# Patient Record
Sex: Female | Born: 1970
Health system: Southern US, Community
[De-identification: ages and names within clinical notes are randomized; demographics above are authoritative.]

## PROBLEM LIST (undated history)

## (undated) DIAGNOSIS — Z789 Other specified health status: Secondary | ICD-10-CM

## (undated) DIAGNOSIS — L0291 Cutaneous abscess, unspecified: Secondary | ICD-10-CM

## (undated) DIAGNOSIS — I00 Rheumatic fever without heart involvement: Secondary | ICD-10-CM

## (undated) DIAGNOSIS — Z872 Personal history of diseases of the skin and subcutaneous tissue: Secondary | ICD-10-CM

## (undated) DIAGNOSIS — S82899A Other fracture of unspecified lower leg, initial encounter for closed fracture: Secondary | ICD-10-CM

## (undated) HISTORY — PX: OTHER SURGICAL HISTORY: SHX169

## (undated) HISTORY — PX: BREAST SURGERY: SHX581

## (undated) HISTORY — DX: Personal history of diseases of the skin and subcutaneous tissue: Z87.2

## (undated) HISTORY — DX: Other specified health status: Z78.9

## (undated) HISTORY — PX: INCISE AND DRAIN ABCESS: PRO64

## (undated) HISTORY — DX: Rheumatic fever without heart involvement: I00

---

## 1999-10-11 ENCOUNTER — Emergency Department (HOSPITAL_COMMUNITY): Admission: EM | Admit: 1999-10-11 | Discharge: 1999-10-11 | Payer: Self-pay | Admitting: Emergency Medicine

## 1999-10-13 ENCOUNTER — Emergency Department (HOSPITAL_COMMUNITY): Admission: EM | Admit: 1999-10-13 | Discharge: 1999-10-13 | Payer: Self-pay | Admitting: Emergency Medicine

## 1999-11-11 ENCOUNTER — Emergency Department (HOSPITAL_COMMUNITY): Admission: EM | Admit: 1999-11-11 | Discharge: 1999-11-11 | Payer: Self-pay | Admitting: Emergency Medicine

## 1999-12-29 ENCOUNTER — Emergency Department (HOSPITAL_COMMUNITY): Admission: EM | Admit: 1999-12-29 | Discharge: 1999-12-29 | Payer: Self-pay | Admitting: Emergency Medicine

## 2000-02-29 ENCOUNTER — Emergency Department (HOSPITAL_COMMUNITY): Admission: EM | Admit: 2000-02-29 | Discharge: 2000-02-29 | Payer: Self-pay | Admitting: Emergency Medicine

## 2000-09-19 ENCOUNTER — Emergency Department (HOSPITAL_COMMUNITY): Admission: EM | Admit: 2000-09-19 | Discharge: 2000-09-19 | Payer: Self-pay | Admitting: Emergency Medicine

## 2003-06-13 ENCOUNTER — Emergency Department (HOSPITAL_COMMUNITY): Admission: AD | Admit: 2003-06-13 | Discharge: 2003-06-13 | Payer: Self-pay | Admitting: Emergency Medicine

## 2003-12-02 ENCOUNTER — Emergency Department (HOSPITAL_COMMUNITY): Admission: EM | Admit: 2003-12-02 | Discharge: 2003-12-02 | Payer: Self-pay | Admitting: Emergency Medicine

## 2003-12-21 ENCOUNTER — Emergency Department (HOSPITAL_COMMUNITY): Admission: EM | Admit: 2003-12-21 | Discharge: 2003-12-21 | Payer: Self-pay | Admitting: Emergency Medicine

## 2004-10-05 ENCOUNTER — Emergency Department (HOSPITAL_COMMUNITY): Admission: EM | Admit: 2004-10-05 | Discharge: 2004-10-05 | Payer: Self-pay | Admitting: Family Medicine

## 2005-03-05 ENCOUNTER — Emergency Department (HOSPITAL_COMMUNITY): Admission: EM | Admit: 2005-03-05 | Discharge: 2005-03-05 | Payer: Self-pay | Admitting: Emergency Medicine

## 2007-03-27 ENCOUNTER — Emergency Department (HOSPITAL_COMMUNITY): Admission: EM | Admit: 2007-03-27 | Discharge: 2007-03-27 | Payer: Self-pay | Admitting: Emergency Medicine

## 2007-10-20 ENCOUNTER — Emergency Department (HOSPITAL_COMMUNITY): Admission: EM | Admit: 2007-10-20 | Discharge: 2007-10-20 | Payer: Self-pay | Admitting: Emergency Medicine

## 2008-11-14 ENCOUNTER — Emergency Department (HOSPITAL_COMMUNITY): Admission: EM | Admit: 2008-11-14 | Discharge: 2008-11-14 | Payer: Self-pay | Admitting: Emergency Medicine

## 2009-04-09 ENCOUNTER — Emergency Department (HOSPITAL_COMMUNITY): Admission: EM | Admit: 2009-04-09 | Discharge: 2009-04-09 | Payer: Self-pay | Admitting: Emergency Medicine

## 2009-09-28 ENCOUNTER — Emergency Department (HOSPITAL_COMMUNITY): Admission: EM | Admit: 2009-09-28 | Discharge: 2009-09-28 | Payer: Self-pay | Admitting: Emergency Medicine

## 2009-10-25 ENCOUNTER — Emergency Department (HOSPITAL_COMMUNITY): Admission: EM | Admit: 2009-10-25 | Discharge: 2009-10-25 | Payer: Self-pay | Admitting: Emergency Medicine

## 2010-04-09 ENCOUNTER — Encounter: Payer: Self-pay | Admitting: Surgery

## 2010-05-17 ENCOUNTER — Emergency Department (HOSPITAL_COMMUNITY)
Admission: EM | Admit: 2010-05-17 | Discharge: 2010-05-17 | Disposition: A | Discharge status: Home / Self Care | Payer: Self-pay | Attending: Emergency Medicine | Admitting: Emergency Medicine

## 2010-05-17 DIAGNOSIS — K089 Disorder of teeth and supporting structures, unspecified: Secondary | ICD-10-CM | POA: Insufficient documentation

## 2010-06-04 LAB — POCT I-STAT, CHEM 8
BUN: 6 mg/dL (ref 6–23)
Calcium, Ion: 1.12 mmol/L (ref 1.12–1.32)
Chloride: 104 mEq/L (ref 96–112)
Glucose, Bld: 95 mg/dL (ref 70–99)
HCT: 46 % (ref 36.0–46.0)
Potassium: 3.8 mEq/L (ref 3.5–5.1)

## 2010-06-04 LAB — STREP A DNA PROBE

## 2010-06-04 LAB — MONONUCLEOSIS SCREEN: Mono Screen: NEGATIVE

## 2010-06-24 LAB — WOUND CULTURE

## 2010-10-17 ENCOUNTER — Emergency Department (HOSPITAL_COMMUNITY)
Admission: EM | Admit: 2010-10-17 | Discharge: 2010-10-17 | Disposition: A | Discharge status: Home / Self Care | Payer: Self-pay | Attending: Emergency Medicine | Admitting: Emergency Medicine

## 2010-10-17 DIAGNOSIS — L723 Sebaceous cyst: Secondary | ICD-10-CM | POA: Insufficient documentation

## 2010-10-17 DIAGNOSIS — Z87898 Personal history of other specified conditions: Secondary | ICD-10-CM | POA: Insufficient documentation

## 2010-10-21 ENCOUNTER — Emergency Department (HOSPITAL_COMMUNITY)
Admission: EM | Admit: 2010-10-21 | Discharge: 2010-10-21 | Disposition: A | Discharge status: Home / Self Care | Payer: Self-pay | Attending: Emergency Medicine | Admitting: Emergency Medicine

## 2010-10-21 DIAGNOSIS — Z23 Encounter for immunization: Secondary | ICD-10-CM | POA: Insufficient documentation

## 2010-10-21 DIAGNOSIS — L02219 Cutaneous abscess of trunk, unspecified: Secondary | ICD-10-CM | POA: Insufficient documentation

## 2010-10-23 ENCOUNTER — Emergency Department (HOSPITAL_COMMUNITY)
Admission: EM | Admit: 2010-10-23 | Discharge: 2010-10-23 | Disposition: A | Discharge status: Home / Self Care | Payer: Self-pay | Attending: Emergency Medicine | Admitting: Emergency Medicine

## 2010-10-23 DIAGNOSIS — L02219 Cutaneous abscess of trunk, unspecified: Secondary | ICD-10-CM | POA: Insufficient documentation

## 2010-10-26 ENCOUNTER — Encounter (INDEPENDENT_AMBULATORY_CARE_PROVIDER_SITE_OTHER): Payer: Self-pay | Admitting: General Surgery

## 2010-12-15 LAB — POCT I-STAT, CHEM 8
Calcium, Ion: 1.15
Creatinine, Ser: 1.1
Glucose, Bld: 96
HCT: 42
Potassium: 4
Sodium: 137
TCO2: 23

## 2010-12-15 LAB — WET PREP, GENITAL: Yeast Wet Prep HPF POC: NONE SEEN

## 2010-12-15 LAB — URINE CULTURE

## 2010-12-15 LAB — POCT URINALYSIS DIP (DEVICE)
Nitrite: NEGATIVE
Operator id: 200941

## 2010-12-15 LAB — URINALYSIS, MICROSCOPIC ONLY
Glucose, UA: NEGATIVE
Specific Gravity, Urine: 1.027
Urobilinogen, UA: 0.2
pH: 5.5

## 2010-12-15 LAB — GC/CHLAMYDIA PROBE AMP, GENITAL
Chlamydia, DNA Probe: NEGATIVE
GC Probe Amp, Genital: NEGATIVE

## 2010-12-15 LAB — RPR: RPR Ser Ql: NONREACTIVE

## 2011-01-09 ENCOUNTER — Emergency Department (HOSPITAL_COMMUNITY): Payer: Self-pay

## 2011-01-09 ENCOUNTER — Emergency Department (HOSPITAL_COMMUNITY)
Admission: EM | Admit: 2011-01-09 | Discharge: 2011-01-09 | Disposition: A | Discharge status: Home / Self Care | Payer: Self-pay | Attending: Emergency Medicine | Admitting: Emergency Medicine

## 2011-01-09 DIAGNOSIS — R42 Dizziness and giddiness: Secondary | ICD-10-CM | POA: Insufficient documentation

## 2011-01-09 DIAGNOSIS — R51 Headache: Secondary | ICD-10-CM | POA: Insufficient documentation

## 2011-01-09 DIAGNOSIS — H539 Unspecified visual disturbance: Secondary | ICD-10-CM | POA: Insufficient documentation

## 2011-03-20 DIAGNOSIS — Z872 Personal history of diseases of the skin and subcutaneous tissue: Secondary | ICD-10-CM

## 2011-03-20 HISTORY — DX: Personal history of diseases of the skin and subcutaneous tissue: Z87.2

## 2011-04-12 ENCOUNTER — Emergency Department (HOSPITAL_COMMUNITY)
Admission: EM | Admit: 2011-04-12 | Discharge: 2011-04-12 | Disposition: A | Discharge status: Home / Self Care | Payer: Self-pay | Attending: Emergency Medicine | Admitting: Emergency Medicine

## 2011-04-12 ENCOUNTER — Emergency Department (HOSPITAL_COMMUNITY): Payer: Self-pay

## 2011-04-12 ENCOUNTER — Encounter (HOSPITAL_COMMUNITY): Payer: Self-pay | Admitting: *Deleted

## 2011-04-12 DIAGNOSIS — N61 Mastitis without abscess: Secondary | ICD-10-CM | POA: Insufficient documentation

## 2011-04-12 DIAGNOSIS — F17200 Nicotine dependence, unspecified, uncomplicated: Secondary | ICD-10-CM | POA: Insufficient documentation

## 2011-04-12 DIAGNOSIS — N611 Abscess of the breast and nipple: Secondary | ICD-10-CM

## 2011-04-12 HISTORY — DX: Cutaneous abscess, unspecified: L02.91

## 2011-04-12 LAB — CBC
HCT: 40.4 % (ref 36.0–46.0)
Hemoglobin: 13.6 g/dL (ref 12.0–15.0)
MCH: 31.6 pg (ref 26.0–34.0)
Platelets: 220 10*3/uL (ref 150–400)

## 2011-04-12 LAB — POCT I-STAT, CHEM 8
BUN: 5 mg/dL — ABNORMAL LOW (ref 6–23)
Calcium, Ion: 1.18 mmol/L (ref 1.12–1.32)
Chloride: 106 mEq/L (ref 96–112)
Creatinine, Ser: 0.9 mg/dL (ref 0.50–1.10)
Glucose, Bld: 93 mg/dL (ref 70–99)
HCT: 38 % (ref 36.0–46.0)
Hemoglobin: 12.9 g/dL (ref 12.0–15.0)
Potassium: 4.4 mEq/L (ref 3.5–5.1)
Sodium: 140 mEq/L (ref 135–145)
TCO2: 25 mmol/L (ref 0–100)

## 2011-04-12 MED ORDER — LIDOCAINE HCL 2 % IJ SOLN
20.0000 mL | Freq: Once | INTRAMUSCULAR | Status: DC
  Filled 2011-04-12: qty 1

## 2011-04-12 MED ORDER — IBUPROFEN 200 MG PO TABS
600.0000 mg | ORAL_TABLET | Freq: Once | ORAL | Status: AC
  Administered 2011-04-12: 600 mg via ORAL
  Filled 2011-04-12: qty 3

## 2011-04-12 MED ORDER — DOXYCYCLINE HYCLATE 100 MG PO CAPS: 100.0000 mg | ORAL_CAPSULE | Freq: Two times a day (BID) | ORAL | Status: AC

## 2011-04-12 MED ORDER — HYDROCODONE-ACETAMINOPHEN 5-325 MG PO TABS: 1.0000 | ORAL_TABLET | ORAL | Status: AC | PRN

## 2011-04-12 MED ORDER — OXYCODONE-ACETAMINOPHEN 5-325 MG PO TABS
1.0000 | ORAL_TABLET | Freq: Once | ORAL | Status: AC
  Administered 2011-04-12: 1 via ORAL
  Filled 2011-04-12: qty 1

## 2011-04-12 NOTE — Progress Notes (Signed)
CM spoke with [name], CCS, who incised abscess and will be following pt.  Discussed choice of [facility] for services and Jovita Kussmaul for pcp.  Orders confirmed with Earl Gala and Darl Pikes, [facility] coordinator as correct for dressing change. Pt will have teachable person at home to learn dressing changes from [facility] staff.

## 2011-04-12 NOTE — Procedures (Signed)
Incision and Drainage Procedure Note  Pre-operative Diagnosis: Left breast abscess  Post-operative Diagnosis: same  Indications: This is a 41 yo female with a 4 day history of worsening pain and swelling just above her areola on her left breast.  She thought she had an abscess and came to [facility] for further treatment.  Anesthesia: 2% plain lidocaine  Procedure Details  The procedure, risks and complications have been discussed in detail (including, but not limited to infection, bleeding) with the patient, and the patient has signed consent to the procedure.  The skin was sterilely prepped and draped over the affected area in the usual fashion. After adequate local anesthesia, I&D with a #11 blade was performed on the left breast at 12 oclock just superior to her left areola. Purulent drainage: present.  The wound was then packed with 1/4 inch iodoform gauze.  Dry dressing was then applied. The patient was observed until stable.  Findings: Approximately 5cc of thick purulent drainage  EBL: 0 cc's  Drains: none  Condition: Tolerated procedure well   Complications: none.  Barnetta Chapel, PA-C 15:37, 04-12-11

## 2011-04-12 NOTE — Progress Notes (Signed)
Spoke with Dr Juleen China about orders needed for pt [facility], [facility] wound protocol) and community MD to follow pt. Pending orders

## 2011-04-12 NOTE — ED Provider Notes (Signed)
History     CSN: [id number]  Arrival date & time 04/12/11  1038   First MD Initiated Contact with Patient 04/12/11 1106      Chief Complaint  Patient presents with  . Abscess    left brease abscess    (Consider location/radiation/quality/duration/timing/severity/associated sxs/prior treatment) Patient is a 41 y.o. female presenting with abscess. The history is provided by the patient.  Abscess  This is a recurrent problem. The current episode started less than one week ago. The onset was gradual. The problem occurs continuously. The problem has been gradually worsening. Affected Location: left breast. The problem is severe. The abscess is characterized by redness, painfulness and swelling. The abscess first occurred at home. Pertinent negatives include no fever. Past medical history comments: prior similar abscess that opened and drained on its own several months ago. There were no sick contacts. She has received no recent medical care.  Assoc with purulent nipple d/c.  Past Medical History  Diagnosis Date  . Abscess     History reviewed. No pertinent past surgical history.  No family history on file.  History  Substance Use Topics  . Smoking status: Current Everyday Smoker  . Smokeless tobacco: Not on file  . Alcohol Use: No     Review of Systems  Constitutional: Negative for fever.  10 systems reviewed and are negative for acute change except as noted in the HPI.   Allergies  Review of patient's allergies indicates no known allergies.  Home Medications   Current Outpatient Rx  Name Route Sig Dispense Refill  . DIPHENHYDRAMINE-APAP (SLEEP) 25-500 MG PO TABS Oral Take 1 tablet by mouth at bedtime as needed.    . IBUPROFEN 200 MG PO TABS Oral Take 200 mg by mouth every 6 (six) hours as needed. pain      BP 119/77  Pulse 90  Temp(Src) 98.2 F (36.8 C) (Oral)  Resp 16  SpO2 98%  LMP 04/08/2011  Physical Exam  Nursing note and vitals  reviewed. Constitutional: She is oriented to person, place, and time. She appears well-developed and well-nourished.       Uncomfortable appearing  HENT:  Head: Normocephalic and atraumatic.  Right Ear: External ear normal.  Left Ear: External ear normal.  Mouth/Throat: Oropharynx is clear and moist.  Eyes: Pupils are equal, round, and reactive to light.  Neck: Normal range of motion. Neck supple.  Cardiovascular: Normal rate, regular rhythm and normal heart sounds.   Pulmonary/Chest: Effort normal and breath sounds normal. No respiratory distress.    Abdominal: Soft. She exhibits no distension. There is no tenderness.  Musculoskeletal: She exhibits no edema and no tenderness.  Neurological: She is alert and oriented to person, place, and time.  Skin: Skin is warm and dry.       See chest wall exam    ED Course  Procedures (including critical care time)  Labs Reviewed  POCT I-STAT, CHEM 8 - Abnormal; Notable for the following:    BUN 5 (*)    All other components within normal limits  CBC  I-STAT, CHEM 8   [place] Misc Soft Tissue  04/12/2011  *RADIOLOGY REPORT*  Clinical Data: 41 year old female with suspicion of left breast abscess.  The patient reports previous draining lesion of the breast.  ULTRASOUND OF HEAD/NECK SOFT TISSUES  Technique:  Ultrasound examination of the head and neck soft tissues was performed in the area of clinical concern.  Comparison:  None.  Findings: Gray scale and color Doppler scanning of  the left breast at the 12 o'clock position reveals a complex hypoechoic lesion measuring 2.9 x 2.0 x 3.1 cm just deep to the skin surface.  The lesion has both echogenic and hypoechoic anechoic internal components.  There is no convincing internal vascularity.  There is mild peripheral hypervascularity on some images.  IMPRESSION: 2.9 x 2.0 x 3.1 cm complex hypoechoic collection just deep to the skin surface at the 12 o'clock position of the left breast.  Favor abscess,  recommend correlation with findings at incision and drainage.  Original Report Authenticated By: Ulla Potash III, M.D.     1. Left breast abscess       MDM  11:30 AM Pt seen and evaluated. Abscess to left breast. [place] ordered for evaluation of depth.   2:00 PM Consult placed to general surgery, spoke with CCS PA who will see pt in ED with Dr Luisa Hart.   3:21 PM Abscess drained by CCS. They will order home health for daily dressing changes. Per request, I will give pt rx for doxycycline and pain medication.        Elwyn Reach Coleman, [place] 04/12/11 1525

## 2011-04-12 NOTE — ED Notes (Signed)
Surgeon at bedside.  

## 2011-04-12 NOTE — ED Provider Notes (Signed)
I received a call from the pharmacy about this patient. Doxycycline had been ordered but it is on national back order right now. Pharmacist requesting change of medication. Pt was in ED for treatment of a breast abscess and seen by [facility].  Medication changed to  Bactrim DS BID for 7 days, disp 14 pills  TG  Dorthula Matas, [place] 04/12/11 1627

## 2011-04-12 NOTE — Consult Note (Signed)
[name] 1970/08/11  [id number].   Primary Care MD: None Requesting MD: Dr. Juleen China Chief Complaint/Reason for Consult: left breast abscess HPI: this is a 41 yo BF who states that back in the summer she developed a left breast abscess, but it spontaneously ruptured.  No further treatment was done for it at that time.  On Sunday, she noticed another abscess forming in the same spot, just above her areola.  It began to get red around it and was extremely painful.  No nipple discharge or drainage from this area.  [facility].  No fevers or chills at home.  We were asked to evaluate for I&D.  Review of Systems: Please see HPI, otherwise all other systems reviewed and are negative.  No family history on file.  Past Medical History  Diagnosis Date  . Abscess     PSH: Diagnostic laparoscopy at age 13  Social History:  reports that she has been smoking.  She does not have any smokeless tobacco history on file. She reports that she does not drink alcohol or use illicit drugs.  Allergies: No Known Allergies  Medications Prior to Admission  Medication Dose Route Frequency Provider Last Rate Last Dose  . ibuprofen (ADVIL,MOTRIN) tablet 600 mg  600 mg Oral Once Shaaron Adler, [place]   600 mg at 04/12/11 1402  . oxyCODONE-acetaminophen (PERCOCET) 5-325 MG per tablet 1 tablet  1 tablet Oral Once Shaaron Adler, [place]   1 tablet at 04/12/11 1227  . DISCONTD: lidocaine (XYLOCAINE) 2 % (with pres) injection 400 mg  20 mL Intradermal Once Raeford Razor, MD       No current outpatient prescriptions on file as of 04/12/2011.    Blood pressure 119/77, pulse 90, temperature 98.2 F (36.8 C), temperature source Oral, resp. rate 16, last menstrual period 04/08/2011, SpO2 98.00%. Physical Exam: General: pleasant WD, WN, female in NAD Heart: regular rate and rhythm, normal s1, s2.  No murmurs, gallops, or rubs.  Palpable radial and pedal pulses  bilaterally Lungs: CTAB, no wheezes, rhonchi, or rales.  Respiratory effort is nonlabored. Breast: left breast with 2x2cm fluctuant abscess at 12 oclock just at the edge of the areola.  No nipple drainage at this time.  No masses or lumps noted.  Erythema, mild, surrounding the entire breast.  No induration.  Right breast is normal with no masses. Psych: A&O x 3 with an appropriate affect.   Results for orders placed during the hospital encounter of 04/12/11 (from the past 48 hour(s))  CBC     Status: Normal   Collection Time   04/12/11 12:40 PM      Component Value Range Comment   WBC 7.0  4.0 - 10.5 (K/uL)    RBC 4.30  3.87 - 5.11 (MIL/uL)    Hemoglobin 13.6  12.0 - 15.0 (g/dL)    HCT 30.8  65.7 - 84.6 (%)    MCV 94.0  78.0 - 100.0 (fL)    MCH 31.6  26.0 - 34.0 (pg)    MCHC 33.7  30.0 - 36.0 (g/dL)    RDW 96.2  95.2 - 84.1 (%)    Platelets 220  150 - 400 (K/uL)   POCT I-STAT, CHEM 8     Status: Abnormal   Collection Time   04/12/11 12:59 PM      Component Value Range Comment   Sodium 140  135 - 145 (mEq/L)    Potassium 4.4  3.5 -  5.1 (mEq/L)    Chloride 106  96 - 112 (mEq/L)    BUN 5 (*) 6 - 23 (mg/dL)    Creatinine, Ser 8.11  0.50 - 1.10 (mg/dL)    Glucose, Bld 93  70 - 99 (mg/dL)    Calcium, Ion 9.14  1.12 - 1.32 (mmol/L)    TCO2 25  0 - 100 (mmol/L)    Hemoglobin 12.9  12.0 - 15.0 (g/dL)    HCT 78.2  95.6 - 21.3 (%)    [place] Misc Soft Tissue  04/12/2011  *RADIOLOGY REPORT*  Clinical Data: 41 year old female with suspicion of left breast abscess.  The patient reports previous draining lesion of the breast.  ULTRASOUND OF HEAD/NECK SOFT TISSUES  Technique:  Ultrasound examination of the head and neck soft tissues was performed in the area of clinical concern.  Comparison:  None.  Findings: Gray scale and color Doppler scanning of the left breast at the 12 o'clock position reveals a complex hypoechoic lesion measuring 2.9 x 2.0 x 3.1 cm just deep to the skin surface.  The lesion has  both echogenic and hypoechoic anechoic internal components.  There is no convincing internal vascularity.  There is mild peripheral hypervascularity on some images.  IMPRESSION: 2.9 x 2.0 x 3.1 cm complex hypoechoic collection just deep to the skin surface at the 12 o'clock position of the left breast.  Favor abscess, recommend correlation with findings at incision and drainage.  Original Report Authenticated By: Harley Hallmark, M.D.       Assessment/Plan 1. Left breast abscess  Plan: 1. Bedside I&D performed.   2. Will d/c home with home health for dressing changes and doxycycline for 10 days. 3. RTC on 04-24-11 for wound check.  Tunisha Ruland E 04/12/2011, 3:18 PM

## 2011-04-12 NOTE — Procedures (Signed)
Seen and agree  

## 2011-04-12 NOTE — Progress Notes (Signed)
ED CM spoke with [facility] coordinator, Darl Pikes about services needed Darl Pikes requesting [facility] wound care protocol to be also ordered.

## 2011-04-12 NOTE — ED Notes (Signed)
Pt states she has had a abscess on left breast 3 month ago and the abscess has return.

## 2011-04-12 NOTE — ED Notes (Signed)
Ultrasound called due to delay. Not available at this time. Message left.

## 2011-04-12 NOTE — ED Notes (Signed)
Pt states last pain med Tylenol PM last night

## 2011-04-12 NOTE — Progress Notes (Signed)
ED CM spoke with Janett Billow, RN, and [place], [place] about home health services for pt Orders pending home health services.

## 2011-04-12 NOTE — Consult Note (Signed)
Needs I and D.  SEEN AND AGREE

## 2011-04-12 NOTE — Progress Notes (Signed)
ED CM spoke with pt about [facility] services.  Discussed home bound services.  Pt agreed to services and billing processes from [facility].  Pt given [facility] contact information.  Pt sent home with reinforcement supplies from ED staff and expecting home services on 04/13/11.  Cm discussed importance of pcp for follow up care.  Pt provided with a list of self pay [place] providers. Pt prefers/chose [facility] to see from the list Pt provided written resources also for housing, financial assistance, low cost medications, etc.  Pt voiced understanding and appreciation of services.

## 2011-04-13 NOTE — ED Provider Notes (Signed)
Medical screening examination/treatment/procedure(s) were conducted as a shared visit with non-physician practitioner(s) and myself.  I personally evaluated the patient during the encounter.  41 year old female with an abscess her left breast. Do not feel comfortable draining this myself. Ultrasound to assess for extent of involvement. Surgical consult.  Disposition per their recommendations.  Raeford Razor, MD 04/13/11 [phone]

## 2011-04-13 NOTE — ED Provider Notes (Signed)
Medical screening examination/treatment/procedure(s) were performed by non-physician practitioner and as supervising physician I was immediately available for consultation/collaboration.  Danicka Hourihan, MD 04/13/11 0911 

## 2011-04-24 ENCOUNTER — Encounter (INDEPENDENT_AMBULATORY_CARE_PROVIDER_SITE_OTHER): Payer: Self-pay

## 2011-04-27 ENCOUNTER — Encounter (INDEPENDENT_AMBULATORY_CARE_PROVIDER_SITE_OTHER): Payer: Self-pay | Admitting: Surgery

## 2011-04-27 ENCOUNTER — Ambulatory Visit (INDEPENDENT_AMBULATORY_CARE_PROVIDER_SITE_OTHER): Payer: Self-pay | Admitting: Surgery

## 2011-04-27 VITALS — BP 110/68 | HR 72 | Temp 98.6°F | Resp 18 | Ht 66.0 in | Wt 175.2 lb

## 2011-04-27 DIAGNOSIS — N61 Mastitis without abscess: Secondary | ICD-10-CM

## 2011-04-27 DIAGNOSIS — N611 Abscess of the breast and nipple: Secondary | ICD-10-CM

## 2011-04-27 NOTE — Patient Instructions (Signed)
Stop packing.  Return 1 month.  Mammogram in near future.

## 2011-04-27 NOTE — Progress Notes (Signed)
Post returns in followup for a left breast abscess was drained emergency room 2 weeks ago. She is doing well. She still has some swelling around the nipple and a small amount of drainage.  Exam: Left breast shows small incision above the nipple clean. Packing removed. No redness. Induration of nipple mild.  Impression: Left breast abscess improved after incision and drainage  Plan: Return in one month. She is not a mammogram and will need one some point.

## 2011-05-21 ENCOUNTER — Encounter (INDEPENDENT_AMBULATORY_CARE_PROVIDER_SITE_OTHER): Payer: Self-pay | Admitting: Surgery

## 2011-06-22 ENCOUNTER — Encounter (HOSPITAL_COMMUNITY): Payer: Self-pay

## 2011-06-22 ENCOUNTER — Emergency Department (HOSPITAL_COMMUNITY)
Admission: EM | Admit: 2011-06-22 | Discharge: 2011-06-22 | Disposition: A | Discharge status: Home / Self Care | Payer: No Typology Code available for payment source | Attending: Emergency Medicine | Admitting: Emergency Medicine

## 2011-06-22 DIAGNOSIS — IMO0002 Reserved for concepts with insufficient information to code with codable children: Secondary | ICD-10-CM

## 2011-06-22 LAB — POCT PREGNANCY, URINE: Preg Test, Ur: NEGATIVE

## 2011-06-22 MED ORDER — PROMETHAZINE HCL 25 MG PO TABS
ORAL_TABLET | ORAL | Status: AC
  Filled 2011-06-22: qty 3

## 2011-06-22 MED ORDER — LEVONORGESTREL 0.75 MG PO TABS
ORAL_TABLET | ORAL | Status: AC
  Administered 2011-06-22: 1.5 mg
  Filled 2011-06-22: qty 2

## 2011-06-22 MED ORDER — METRONIDAZOLE 500 MG PO TABS
ORAL_TABLET | ORAL | Status: AC
  Filled 2011-06-22: qty 4

## 2011-06-22 MED ORDER — AZITHROMYCIN 1 G PO PACK
PACK | ORAL | Status: AC
  Administered 2011-06-22: 1 g
  Filled 2011-06-22: qty 1

## 2011-06-22 MED ORDER — CEFIXIME 400 MG PO TABS
ORAL_TABLET | ORAL | Status: AC
  Administered 2011-06-22: 400 mg
  Filled 2011-06-22: qty 1
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
## 2011-06-25 ENCOUNTER — Encounter (INDEPENDENT_AMBULATORY_CARE_PROVIDER_SITE_OTHER): Payer: Self-pay | Admitting: Surgery

## 2011-07-17 ENCOUNTER — Encounter (INDEPENDENT_AMBULATORY_CARE_PROVIDER_SITE_OTHER): Payer: Self-pay | Admitting: Surgery

## 2011-07-24 ENCOUNTER — Encounter (INDEPENDENT_AMBULATORY_CARE_PROVIDER_SITE_OTHER): Payer: Self-pay | Admitting: Surgery

## 2012-01-02 ENCOUNTER — Encounter (HOSPITAL_COMMUNITY): Payer: Self-pay | Admitting: *Deleted

## 2012-01-02 ENCOUNTER — Emergency Department (HOSPITAL_COMMUNITY)
Admission: EM | Admit: 2012-01-02 | Discharge: 2012-01-02 | Disposition: A | Discharge status: Home / Self Care | Payer: Self-pay | Attending: Emergency Medicine | Admitting: Emergency Medicine

## 2012-01-02 DIAGNOSIS — N760 Acute vaginitis: Secondary | ICD-10-CM | POA: Insufficient documentation

## 2012-01-02 DIAGNOSIS — A499 Bacterial infection, unspecified: Secondary | ICD-10-CM | POA: Insufficient documentation

## 2012-01-02 DIAGNOSIS — B9689 Other specified bacterial agents as the cause of diseases classified elsewhere: Secondary | ICD-10-CM | POA: Insufficient documentation

## 2012-01-02 DIAGNOSIS — N611 Abscess of the breast and nipple: Secondary | ICD-10-CM

## 2012-01-02 DIAGNOSIS — N61 Mastitis without abscess: Secondary | ICD-10-CM | POA: Insufficient documentation

## 2012-01-02 MED ORDER — ONDANSETRON 4 MG PO TBDP
4.0000 mg | ORAL_TABLET | Freq: Once | ORAL | Status: AC
  Administered 2012-01-02: 4 mg via ORAL
  Filled 2012-01-02: qty 1

## 2012-01-02 MED ORDER — MORPHINE SULFATE 10 MG/ML IJ SOLN
10.0000 mg | Freq: Once | INTRAMUSCULAR | Status: AC
  Administered 2012-01-02: 10 mg via INTRAMUSCULAR
  Filled 2012-01-02: qty 1

## 2012-01-02 MED ORDER — LIDOCAINE HCL 2 % IJ SOLN
INTRAMUSCULAR | Status: AC
  Filled 2012-01-02: qty 20

## 2012-01-02 NOTE — ED Notes (Signed)
Surgeons at bedside.

## 2012-01-02 NOTE — Consult Note (Signed)
  [name] 02/24/1971  [id number].   Requesting MD: Dr. Rolan Bucco Chief Complaint/Reason for Consult: left breast abscess HPI: This is a 41 yo female who had a left breast I&D done earlier this year in February.  She began noticing a recurrence in this same area 2 days ago.  Pain progressively worsened.  Today she decided to come to [facility] for evaluation.  We were asked to see her for I&D.    Review of Systems:  Please see HPI ,other wise all other systems are negative  Family History  Problem Relation Age of Onset  . Heart disease Paternal Grandmother     Past Medical History  Diagnosis Date  . Abscess   . Rheumatic fever     Past Surgical History  Procedure Date  . Incise and drain abcess     lt breast    Social History:  reports that she has been smoking.  She does not have any smokeless tobacco history on file. She reports that she drinks alcohol. She reports that she does not use illicit drugs.  Allergies: No Known Allergies   (Not in a hospital admission)  Blood pressure 116/70, pulse 90, temperature 98.5 F (36.9 C), temperature source Oral, resp. rate 16, last menstrual period 12/10/2011, SpO2 99.00%. Physical Exam: General: Pleasant, WD, WN, 41 yo BF laying in bed in NAD Breast: left breast with circumferential erythema around her areola.  Some induration noted just superior to her areola.  Slight inversion of her nipple is noted.  Old scar present from previous I&D Right breast is normal in appearance.  I&D performed.  See procedure note Psych: A&O x3. Appropriate affect    No results found for this or any previous visit (from the past 48 hour(s)). No results found.     Assessment/Plan 1. 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 I&D performed today to take care of the acute infection.  She will need to return to see Dr. Johna Sheriff to consider another operation in the near future to excise this sinus  tract.  For now she has packing in place.  She can remove this on Friday.  After that, she can place dry gauze over incision and return to see Dr. Johna Sheriff for follow up.  She is given 7 days of doxycyline.  Dream Nodal E 01/02/2012, 2:47 PM Pager: [phone]

## 2012-01-02 NOTE — ED Notes (Signed)
Pt reports L breast abcess. Pain since yesterday. Redness and swelling noted. Hx same previously.

## 2012-01-02 NOTE — Progress Notes (Signed)
Pt with no pcp nor coverage as confirmed by pt CM spoke with pt to review list of self pay pcps to further assist her with prescriptions and health care.  Discussed and provided written information for health reform, free community cervical screening, discounted pharmacies, DSS, health dept, needymeds.org and financial assistance programs in [employer]  Pt voiced understanding and appreciation of resources and services offered

## 2012-01-02 NOTE — Consult Note (Signed)
Patient interviewed and examined, agree with PA note above.  [name] T [name] MD, FACS  01/02/2012 6:17 PM   

## 2012-01-02 NOTE — ED Notes (Signed)
Pt moved to acute room for general surgery consult.

## 2012-01-02 NOTE — Procedures (Signed)
Incision and Drainage Procedure Note  Pre-operative Diagnosis: Left breast abscess, recurrent  Post-operative Diagnosis: same  Indications: This is a 41 yo female who smokes who has a history of a prior left breast abscess that I I&D earlier this year.  She noticed it starting again 2 days ago.  She woke up this morning with severe pain and decided to come to [facility] to have this area on her left breast evaluated.  She was found to have a recurrent left breast abscess.  Anesthesia: 1% lidocaine with epinephrine  Procedure Details  The procedure, risks and complications have been discussed in detail (including, but not limited to airway compromise, infection, bleeding) with the patient, and the patient has signed consent to the procedure.  The skin was sterilely prepped and draped over the affected area in the usual fashion. After adequate local anesthesia, I&D with a #11 blade was performed on the left breast just superior to her areola. Purulent drainage: present, but mostly serosanguinous, cyst like drainage.  When patient was being anesthetized there was some expression of purulent drainage with lidocaine through a duct in her nipple.  After making the incision a cavity was found that was possible chronic in nature.  After I&D complete it was packed with 1/4" iodoform gauze. The patient was observed until stable.  Findings: Likely chronic sinus tract causing recurrent left breast abscesses  EBL: 2 cc's  Drains: none  Condition: Tolerated procedure well and Stable   Complications: none.  Lakira Ogando E 01/02/2012

## 2012-01-02 NOTE — ED Notes (Signed)
Pt taking metronidazole 500mg  since 12/28/11 for vaginal bacteria - reports taking only 2 pills, left breast tenderness/pain developed, abscess above left nipple noted. Pt reports having abscess that was drained in January 2013.

## 2012-01-08 ENCOUNTER — Telehealth (INDEPENDENT_AMBULATORY_CARE_PROVIDER_SITE_OTHER): Payer: Self-pay

## 2012-01-08 NOTE — Telephone Encounter (Deleted)
Message copied by Maryan Puls on Tue Jan 08, 2012 10:58 AM ------      Message from: Barnetta Chapel      Created: Fri Jan 04, 2012  7:11 AM      Regarding: RE: f/u appt       Thanks!      ----- Message -----         From: Maryan Puls, CMA         Sent: 01/03/2012   6:39 PM           To: Letha Cape, PA      Subject: f/u appt                                                 Patient has follow up appt on 01/11/12 @ 4:30 w/Dr. Johna Sheriff.            [name]      ----- Message -----         From: Letha Cape, PA         Sent: 01/02/2012   2:55 PM           To: Maryan Puls, CMA            This is a patient I did an I&D of a left breast abscess today.  She needs to see BH in 1-2 weeks for follow up and discuss excision of chronic sinus tract.  If you could call and schedule this for her I would appreciate it.  She was sent home from ED.

## 2012-01-08 NOTE — Telephone Encounter (Signed)
Left message for patient to call our office re: Reminder call  appointment with Dr. Johna Sheriff on 01/11/12 @ 4:30 pm.

## 2012-01-11 ENCOUNTER — Ambulatory Visit (INDEPENDENT_AMBULATORY_CARE_PROVIDER_SITE_OTHER): Payer: Self-pay | Admitting: General Surgery

## 2012-01-11 ENCOUNTER — Encounter (INDEPENDENT_AMBULATORY_CARE_PROVIDER_SITE_OTHER): Payer: Self-pay | Admitting: General Surgery

## 2012-01-11 VITALS — BP 102/76 | HR 76 | Temp 97.8°F | Resp 16 | Wt 182.8 lb

## 2012-01-11 DIAGNOSIS — N611 Abscess of the breast and nipple: Secondary | ICD-10-CM

## 2012-01-11 DIAGNOSIS — N61 Mastitis without abscess: Secondary | ICD-10-CM

## 2012-01-11 MED ORDER — OXYCODONE-ACETAMINOPHEN 5-325 MG PO TABS: 1.0000 | ORAL_TABLET | ORAL | Status: DC | PRN

## 2012-01-11 MED ORDER — CIPROFLOXACIN HCL 500 MG PO TABS: 500.0000 mg | ORAL_TABLET | Freq: Two times a day (BID) | ORAL | Status: AC

## 2012-01-11 NOTE — Progress Notes (Signed)
Chief complaint: Followup breast abscess History: Patient returns for followup of her recurrent subareolar abscess, status post I&D 2 weeks ago. She states she is still having quite a bit of pain.  Exam: The I&D site in the periareolar area still has some purulent drainage. I cleaned this out with saline and a gauze applicator and it appears open and well drained. I repacked this without a formal gauze and should remove this in 2 days.  Assessment and plan: Status post I&D of periareolar abscess. The inflammation and swelling is gone down and it appears to be well drained. 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 I discussed that she will need excision once all the acute inflammation has resolved. She was unable to take Flagyl and I gave her prescription for 10 days of Cipro. Return in 2 weeks.

## 2012-01-21 NOTE — ED Provider Notes (Signed)
History     CSN: [id number]  Arrival date & time 01/02/12  4098   First MD Initiated Contact with Patient 01/02/12 [id number]      Chief Complaint  Patient presents with  . Breast Pain  . Breast Abcess     (Consider location/radiation/quality/duration/timing/severity/associated sxs/prior treatment) HPI Comments: [name] 41 y.o. female   The chief complaint is: Patient presents with:   Breast Pain   Breast Abcess     C/O left breast abscess.  One previous lin jan 2013, same breast with hx of I&D. Patient states that this feels the same. Noticed breast tenderness behind the nipple yesterday . Pain, redness, heat and swelling worsening in its course.  Constant, 10/10, throbbing and aching.  Worse with palpation. Nothing gives relief.  Denies fevers, chills, myalgias, arthralgias, nausea, vomiting, diarrhea.   Denies fevers, chills, myalgias, arthralgias. Denies DOE, SOB, chest tightness or pressure, radiation to left arm, jaw or back, or diaphoresis. Denies dysuria, flank pain, suprapubic pain, frequency, urgency, or hematuria. Denies headaches, light headedness, weakness, visual disturbances. Denies abdominal pain, nausea, vomiting, diarrhea or constipation.     The history is provided by the patient.    Past Medical History  Diagnosis Date  . Abscess   . Rheumatic fever   . History of breast abscess 2013    Past Surgical History  Procedure Date  . Incise and drain abcess     lt breast    Family History  Problem Relation Age of Onset  . Heart disease Paternal Grandmother     History  Substance Use Topics  . Smoking status: Current Every Day Smoker -- 0.5 packs/day  . Smokeless tobacco: Not on file  . Alcohol Use: Yes    OB History    Grav Para Term Preterm Abortions TAB SAB Ect Mult Living                  Review of Systems  Constitutional: Negative.   HENT: Negative.   Eyes: Negative.   Respiratory: Negative.   Cardiovascular: Negative.     Gastrointestinal: Negative.   Genitourinary: Negative.   Musculoskeletal: Negative.   Skin: Positive for color change.  Neurological: Negative.   Hematological: Negative.   Psychiatric/Behavioral: Negative.   All other systems reviewed and are negative.    Allergies  Review of patient's allergies indicates no known allergies.  Home Medications   Current Outpatient Rx  Name  Route  Sig  Dispense  Refill  . IBUPROFEN 200 MG PO TABS   Oral   Take 400 mg by mouth every 8 (eight) hours as needed. For headache/pain         . METRONIDAZOLE 500 MG PO TABS   Oral   Take 500 mg by mouth 2 (two) times daily. x7 days. Pt only took 2 doses on 10/11 then d/c'd         . CIPROFLOXACIN HCL 500 MG PO TABS   Oral   Take 1 tablet (500 mg total) by mouth 2 (two) times daily.   20 tablet   0   . OXYCODONE-ACETAMINOPHEN 5-325 MG PO TABS   Oral   Take 1 tablet by mouth every 4 (four) hours as needed for pain.   30 tablet   0     BP 116/70  Pulse 90  Temp 98.5 F (36.9 C) (Oral)  Resp 16  SpO2 99%  LMP 12/10/2011  Physical Exam  Nursing note and vitals reviewed. Constitutional: She is oriented to  person, place, and time. She appears well-developed and well-nourished. No distress.  HENT:  Head: Normocephalic and atraumatic.  Eyes: Conjunctivae normal are normal. No scleral icterus.  Neck: Normal range of motion.  Cardiovascular: Normal rate, regular rhythm and normal heart sounds.  Exam reveals no gallop and no friction rub.   No murmur heard. Pulmonary/Chest: Effort normal and breath sounds normal. No respiratory distress.  Abdominal: Soft. Bowel sounds are normal. She exhibits no distension and no mass. There is no tenderness. There is no guarding.  Genitourinary:       Breast exam: Left breast examined. Large area of erythm posterior to  And completely encircling the L nipple and areola surgical scars noted superior to areola from previous I&D, TTP. No lymphangitis or  abnormal masses, no axillary LAD.   Neurological: She is alert and oriented to person, place, and time.  Skin: Skin is warm and dry. She is not diaphoretic.    ED Course  Procedures (including critical care time)  Labs Reviewed - No data to display No results found.    1. Left breast abscess       MDM  Pattient seen in shared visit with Dr. Roel Cluck.  I have spokent with PA Earl Gala from surgery who has agreed to to evaluate and treat the patient.    Filed Vitals:   01/02/12 0856  BP: 116/70  Pulse: 90  Temp: 98.5 F (36.9 C)  TempSrc: Oral  Resp: 16  SpO2: 99%   I&D performed.  Discharge and f/u handled by surgery. She will follow up with [facility]. Discussed reasons to seek immediate care. Patient expresses understanding and agrees with plan.         Arthor Captain, PA-C 01/21/12 2204

## 2012-01-23 ENCOUNTER — Encounter (INDEPENDENT_AMBULATORY_CARE_PROVIDER_SITE_OTHER): Payer: Self-pay

## 2012-01-25 ENCOUNTER — Encounter (INDEPENDENT_AMBULATORY_CARE_PROVIDER_SITE_OTHER): Payer: Self-pay | Admitting: General Surgery

## 2012-01-27 NOTE — ED Provider Notes (Signed)
Medical screening examination/treatment/procedure(s) were performed by non-physician practitioner and as supervising physician I was immediately available for consultation/collaboration.   Rolan Bucco, MD 01/27/12 [phone]

## 2012-03-20 ENCOUNTER — Encounter (INDEPENDENT_AMBULATORY_CARE_PROVIDER_SITE_OTHER): Payer: Self-pay | Admitting: General Surgery

## 2012-03-28 ENCOUNTER — Encounter (INDEPENDENT_AMBULATORY_CARE_PROVIDER_SITE_OTHER): Payer: Self-pay | Admitting: General Surgery

## 2012-06-08 ENCOUNTER — Encounter (HOSPITAL_COMMUNITY): Payer: Self-pay | Admitting: *Deleted

## 2012-06-08 ENCOUNTER — Emergency Department (HOSPITAL_COMMUNITY)
Admission: EM | Admit: 2012-06-08 | Discharge: 2012-06-08 | Disposition: A | Discharge status: Home / Self Care | Payer: Self-pay | Attending: Emergency Medicine | Admitting: Emergency Medicine

## 2012-06-08 DIAGNOSIS — IMO0002 Reserved for concepts with insufficient information to code with codable children: Secondary | ICD-10-CM | POA: Insufficient documentation

## 2012-06-08 DIAGNOSIS — Z872 Personal history of diseases of the skin and subcutaneous tissue: Secondary | ICD-10-CM | POA: Insufficient documentation

## 2012-06-08 DIAGNOSIS — Z8619 Personal history of other infectious and parasitic diseases: Secondary | ICD-10-CM | POA: Insufficient documentation

## 2012-06-08 DIAGNOSIS — F17200 Nicotine dependence, unspecified, uncomplicated: Secondary | ICD-10-CM | POA: Insufficient documentation

## 2012-06-08 DIAGNOSIS — L02412 Cutaneous abscess of left axilla: Secondary | ICD-10-CM

## 2012-06-08 MED ORDER — SULFAMETHOXAZOLE-TRIMETHOPRIM 800-160 MG PO TABS: 2.0000 | ORAL_TABLET | Freq: Two times a day (BID) | ORAL | Status: DC

## 2012-06-08 MED ORDER — OXYCODONE-ACETAMINOPHEN 5-325 MG PO TABS: 1.0000 | ORAL_TABLET | Freq: Four times a day (QID) | ORAL | Status: DC | PRN

## 2012-06-08 MED ORDER — IBUPROFEN 800 MG PO TABS
800.0000 mg | ORAL_TABLET | Freq: Once | ORAL | Status: AC
  Administered 2012-06-08: 800 mg via ORAL
  Filled 2012-06-08: qty 1

## 2012-06-08 MED ORDER — OXYCODONE-ACETAMINOPHEN 5-325 MG PO TABS
1.0000 | ORAL_TABLET | Freq: Once | ORAL | Status: AC
  Administered 2012-06-08: 1 via ORAL
  Filled 2012-06-08: qty 1

## 2012-06-08 NOTE — ED Provider Notes (Signed)
  Medical screening examination/treatment/procedure(s) were performed by non-physician practitioner and as supervising physician I was immediately available for consultation/collaboration.    Gerhard Munch, MD 06/08/12 1537

## 2012-06-08 NOTE — ED Provider Notes (Signed)
History     CSN: [id number]  Arrival date & time 06/08/12  1205   First MD Initiated Contact with Patient 06/08/12 1226      Chief Complaint  Patient presents with  . Abscess    (Consider location/radiation/quality/duration/timing/severity/associated sxs/prior treatment) HPI [name] is a 42 year old female with past history of abscesses who presents to the ED with swelling and painful masses under her left arm and anterior to her left ear which she believes are abscesses. The mass near her left ear began 1 week ago. She states she thought it was a blackhead and scratched it. It has continued to swell and has become more painful. The mass under her left arm began 3 days ago. She reports the pain is worsening, the mass is increasing in size, and it hurts to move her arm. She does report shaving her axilla recently and states this probably irritated the painful mass. She has used warm compresses on both masses, which has provided some relief of the pain. She has also taken some ibuprofen and gets some mild relief of the pain. She denies fever, chills, nausea, and vomiting. She denies any erythematous streaking. She reports having a history of abscesses on her legs, thoracic back, and left armpit. She states her partner doesn't "wash his sheets very often". They are sexually active and use condoms. She denies any history of HIV and reports recently being tested for STDs including HIV.  Past Medical History  Diagnosis Date  . Abscess   . Rheumatic fever   . History of breast abscess 2013    Past Surgical History  Procedure Laterality Date  . Incise and drain abcess      lt breast    Family History  Problem Relation Age of Onset  . Heart disease Paternal Grandmother     History  Substance Use Topics  . Smoking status: Current Every Day Smoker -- 0.50 packs/day    Types: Cigarettes  . Smokeless tobacco: Not on file  . Alcohol Use: Yes    OB History   Grav Para Term Preterm  Abortions TAB SAB Ect Mult Living                  Review of Systems All other systems negative except as documented in the HPI. All pertinent positives and negatives as reviewed in the HPI.  Allergies  Hydrocodone  Home Medications   Current Outpatient Rx  Name  Route  Sig  Dispense  Refill  . ibuprofen (ADVIL,MOTRIN) 200 MG tablet   Oral   Take 400 mg by mouth every 8 (eight) hours as needed. For headache/pain           BP 139/96  Pulse 82  Temp(Src) 99.3 F (37.4 C) (Oral)  Resp 18  SpO2 95%  LMP 05/18/2012  Physical Exam  Constitutional: She appears well-developed and well-nourished. No distress.  HENT:  Head: Normocephalic and atraumatic.    Neck: Normal range of motion. Neck supple.  Skin:       ED Course  Procedures (including critical care time)  INCISION AND DRAINAGE Performed by: Carlyle Dolly Consent: Verbal consent obtained. Risks and benefits: risks, benefits and alternatives were discussed Type: abscess  Body area:L axilla superiorly  Anesthesia: local infiltration  Incision was made with a scalpel.  Local anesthetic: lidocaine 2% w epinephrine  Anesthetic total: 5 ml  Complexity: complex Blunt dissection to break up loculations  Drainage: purulent  Drainage amount: moderate  Packing material:  1/4 in iodoform gauze  Patient tolerance: Patient tolerated the procedure well with no immediate complications.   Patient will be referred to ENT for the area on her face in the preauricular region which could be a lymph node. Patient told to return here in 2 days for a recheck.  MDM          Carlyle Dolly, PA-C 06/08/12 1443

## 2012-06-08 NOTE — ED Notes (Signed)
Pt has abscess to left underarm and to left face below ear. Reports hx of same.

## 2012-11-26 ENCOUNTER — Encounter (HOSPITAL_COMMUNITY): Payer: Self-pay | Admitting: *Deleted

## 2012-11-26 ENCOUNTER — Emergency Department (HOSPITAL_COMMUNITY)
Admission: EM | Admit: 2012-11-26 | Discharge: 2012-11-26 | Disposition: A | Discharge status: Home / Self Care | Payer: Self-pay | Attending: Emergency Medicine | Admitting: Emergency Medicine

## 2012-11-26 ENCOUNTER — Emergency Department (HOSPITAL_COMMUNITY): Payer: Self-pay

## 2012-11-26 DIAGNOSIS — Z8619 Personal history of other infectious and parasitic diseases: Secondary | ICD-10-CM | POA: Insufficient documentation

## 2012-11-26 DIAGNOSIS — W2203XA Walked into furniture, initial encounter: Secondary | ICD-10-CM | POA: Insufficient documentation

## 2012-11-26 DIAGNOSIS — S9031XA Contusion of right foot, initial encounter: Secondary | ICD-10-CM

## 2012-11-26 DIAGNOSIS — Y9301 Activity, walking, marching and hiking: Secondary | ICD-10-CM | POA: Insufficient documentation

## 2012-11-26 DIAGNOSIS — Z872 Personal history of diseases of the skin and subcutaneous tissue: Secondary | ICD-10-CM | POA: Insufficient documentation

## 2012-11-26 DIAGNOSIS — S9030XA Contusion of unspecified foot, initial encounter: Secondary | ICD-10-CM | POA: Insufficient documentation

## 2012-11-26 DIAGNOSIS — Y929 Unspecified place or not applicable: Secondary | ICD-10-CM | POA: Insufficient documentation

## 2012-11-26 DIAGNOSIS — F17200 Nicotine dependence, unspecified, uncomplicated: Secondary | ICD-10-CM | POA: Insufficient documentation

## 2012-11-26 DIAGNOSIS — M25461 Effusion, right knee: Secondary | ICD-10-CM

## 2012-11-26 DIAGNOSIS — M25469 Effusion, unspecified knee: Secondary | ICD-10-CM | POA: Insufficient documentation

## 2012-11-26 MED ORDER — IBUPROFEN 800 MG PO TABS
800.0000 mg | ORAL_TABLET | Freq: Once | ORAL | Status: AC
Start: 2012-11-26 — End: 2012-11-26
  Administered 2012-11-26: 800 mg via ORAL
  Filled 2012-11-26: qty 1

## 2012-11-26 MED ORDER — IBUPROFEN 800 MG PO TABS: 800.0000 mg | ORAL_TABLET | Freq: Three times a day (TID) | ORAL | Status: DC | PRN

## 2012-11-26 MED ORDER — METHOCARBAMOL 500 MG PO TABS
500.0000 mg | ORAL_TABLET | Freq: Two times a day (BID) | ORAL | Status: DC
Start: 2012-11-26 — End: 2013-02-20

## 2012-11-26 NOTE — ED Provider Notes (Signed)
CSN: [id number]     Arrival date & time 11/26/12  0831 History   First MD Initiated Contact with Patient 11/26/12 1003     No chief complaint on file.  (Consider location/radiation/quality/duration/timing/severity/associated sxs/prior Treatment) HPI  42 year old female presents complaining of right knee pain. Patient reports gradual onset of right knee pain ongoing for the past several months. Pain is described as a sharp and aching sensation with "popping sound" with bending. She has noticed swelling to her right knee worsened when she stands for prolonged period time and improves when she elevated her knee. She has tried taking Tylenol home with minimal improvement. 2 days ago while walking patient accidentally injured her right foot when hitting it against the edge of a chair. She was able to ambulate afterward but has noticed a bruise on top of foot. Does not think she suffered any broken bone.  No specific treatment tried.  Otherwise patient denies any fever, numbness, weakness, or rash.  No recent injury.  Past Medical History  Diagnosis Date  . Abscess   . Rheumatic fever   . History of breast abscess 2013   Past Surgical History  Procedure Laterality Date  . Incise and drain abcess      lt breast   Family History  Problem Relation Age of Onset  . Heart disease Paternal Grandmother    History  Substance Use Topics  . Smoking status: Current Every Day Smoker -- 0.50 packs/day    Types: Cigarettes  . Smokeless tobacco: Not on file  . Alcohol Use: Yes   OB History   Grav Para Term Preterm Abortions TAB SAB Ect Mult Living                 Review of Systems  Constitutional: Negative for fever.  Musculoskeletal: Positive for arthralgias. Negative for myalgias.  Neurological: Negative for numbness.    Allergies  Hydrocodone  Home Medications   Current Outpatient Rx  Name  Route  Sig  Dispense  Refill  . acetaminophen (TYLENOL) 500 MG tablet   Oral   Take 500 mg by  mouth every 6 (six) hours as needed for pain.          BP 92/60  Pulse 70  Temp(Src) 98.7 F (37.1 C) (Oral)  Resp 18  SpO2 95%  LMP 11/05/2012 Physical Exam  Nursing note and vitals reviewed. Constitutional: She is oriented to person, place, and time. She appears well-developed and well-nourished. No distress.  HENT:  Head: Atraumatic.  Eyes: Conjunctivae are normal.  Neck: Neck supple.  Musculoskeletal: She exhibits tenderness (R knee: mild edema and tenderness noted to anterior knee lateral to patella.  FROM with flexion/extension, no joint laxity, no rash, normal overlying skin.  ).  Neurological: She is alert and oriented to person, place, and time.  Skin: Skin is warm. No rash noted.  R foot: small hematoma noted to mid dorsum of foot, ttp, no crepitus or deformity.    Psychiatric: She has a normal mood and affect.    ED Course  Procedures (including critical care time)  10:41 AM Chronic R knee pain, no evidence to suggest septic joint, gout, or acute fx/dislocation.  Will provide knee sleeve.  Pt request xray, will obtain xray.  R foot with small hematoma to dorsum, but able to ambulate without difficulty.  Xray deferred.  RICE discussed.    Labs Review Labs Reviewed - No data to display Imaging Review Dg Knee Complete 4 Views Right  11/26/2012   *  RADIOLOGY REPORT*  Clinical Data: Right knee pain  RIGHT KNEE - COMPLETE 4+ VIEW  Comparison: None.  Findings: No acute fracture or dislocation is noted. Small joint effusion is noted.  No other focal abnormality is seen.  IMPRESSION: Small joint effusion without acute bony abnormality.   Original Report Authenticated By: Alcide Clever, M.D.    MDM   1. Knee effusion, right   2. Traumatic hematoma of right foot    BP 92/60  Pulse 70  Temp(Src) 98.7 F (37.1 C) (Oral)  Resp 18  SpO2 95%  LMP 11/05/2012  I have reviewed nursing notes and vital signs. I personally reviewed the imaging tests through PACS system  I  reviewed available ER/hospitalization records thought the EMR     Fayrene Helper, PA-C 11/26/12 1141

## 2012-11-26 NOTE — ED Notes (Signed)
Pt is c/o right knee pain and swelling, sts knee "is popping a lot". Pt also sts can't bend her knee. It's been going on for a couple of months.

## 2012-11-26 NOTE — Progress Notes (Signed)
[facility] provided pt with a list of primary care resources in [place].  °

## 2012-11-26 NOTE — ED Provider Notes (Signed)
Medical screening examination/treatment/procedure(s) were performed by non-physician practitioner and as supervising physician I was immediately available for consultation/collaboration.   Gwyneth Sprout, MD 11/26/12 1513

## 2012-12-16 ENCOUNTER — Encounter (HOSPITAL_COMMUNITY): Payer: Self-pay | Admitting: Emergency Medicine

## 2012-12-16 ENCOUNTER — Emergency Department (HOSPITAL_COMMUNITY)
Admission: EM | Admit: 2012-12-16 | Discharge: 2012-12-16 | Disposition: A | Discharge status: Home / Self Care | Payer: Self-pay | Attending: Emergency Medicine | Admitting: Emergency Medicine

## 2012-12-16 DIAGNOSIS — R112 Nausea with vomiting, unspecified: Secondary | ICD-10-CM | POA: Insufficient documentation

## 2012-12-16 DIAGNOSIS — F17200 Nicotine dependence, unspecified, uncomplicated: Secondary | ICD-10-CM | POA: Insufficient documentation

## 2012-12-16 DIAGNOSIS — R61 Generalized hyperhidrosis: Secondary | ICD-10-CM | POA: Insufficient documentation

## 2012-12-16 DIAGNOSIS — L0390 Cellulitis, unspecified: Secondary | ICD-10-CM

## 2012-12-16 DIAGNOSIS — Z8679 Personal history of other diseases of the circulatory system: Secondary | ICD-10-CM | POA: Insufficient documentation

## 2012-12-16 DIAGNOSIS — Z79899 Other long term (current) drug therapy: Secondary | ICD-10-CM | POA: Insufficient documentation

## 2012-12-16 DIAGNOSIS — N61 Mastitis without abscess: Secondary | ICD-10-CM | POA: Insufficient documentation

## 2012-12-16 MED ORDER — MORPHINE SULFATE 4 MG/ML IJ SOLN
4.0000 mg | Freq: Once | INTRAMUSCULAR | Status: AC
  Administered 2012-12-16: 4 mg via INTRAMUSCULAR
  Filled 2012-12-16: qty 1

## 2012-12-16 MED ORDER — ONDANSETRON 8 MG PO TBDP
8.0000 mg | ORAL_TABLET | Freq: Once | ORAL | Status: AC
  Administered 2012-12-16: 8 mg via ORAL
  Filled 2012-12-16: qty 1

## 2012-12-16 MED ORDER — PROMETHAZINE HCL 25 MG PO TABS: 25.0000 mg | ORAL_TABLET | Freq: Four times a day (QID) | ORAL | Status: DC | PRN

## 2012-12-16 MED ORDER — SULFAMETHOXAZOLE-TRIMETHOPRIM 800-160 MG PO TABS: 1.0000 | ORAL_TABLET | Freq: Two times a day (BID) | ORAL | Status: DC

## 2012-12-16 MED ORDER — OXYCODONE-ACETAMINOPHEN 5-325 MG PO TABS
2.0000 | ORAL_TABLET | Freq: Four times a day (QID) | ORAL | Status: DC | PRN
Start: 2012-12-16 — End: 2013-02-20

## 2012-12-16 MED ORDER — CEPHALEXIN 500 MG PO CAPS: 500.0000 mg | ORAL_CAPSULE | Freq: Four times a day (QID) | ORAL | Status: DC

## 2012-12-16 NOTE — ED Notes (Signed)
Pt reports one week hx of swelling and tenderness around r/nipple

## 2012-12-16 NOTE — ED Provider Notes (Signed)
CSN: [id number]     Arrival date & time 12/16/12  1305 History  This chart was scribed for non-physician practitioner working with Ross Marcus, MD by Ashley Jacobs, ED scribe. This patient was seen in room WTR5/WTR5 and the patient's care was started at 2:20 PM.    Chief Complaint  Patient presents with  . Breast Pain    swelling and redness on r/breast   (Consider location/radiation/quality/duration/timing/severity/associated sxs/prior Treatment) Patient is a 42 y.o. female presenting with abscess. The history is provided by the patient and medical records. No language interpreter was used.  Abscess Location:  Torso Torso abscess location:  R chest Size:  3 cm Abscess quality: draining, induration, painful and redness   Duration:  1 week Progression:  Worsening Pain details:    Quality:  Sharp and burning   Severity:  Severe   Duration:  1 week   Timing:  Constant   Progression:  Worsening Chronicity:  Recurrent Relieved by:  Nothing Ineffective treatments:  Aspirin Associated symptoms: nausea and vomiting   Risk factors: prior abscess    HPI Comments: [name] is a 42 y.o. female who presents to the Emergency Department complaining of right breast pain from an abscess for one week PTA. Pt is experiencing the associated symptoms of areolar edema, erythema and yellow purulent drainage. Pt describes the pain as a constant sharp and burning sensation that is not resolved by anything.  She reports having one episode of emesis this morning. She has been having trouble sleeping due to the pain. She has a hx of recurrent abscesses and was recently seen in the ED prior to today for an abscess located on her left breast near her areola area. She was treated with an I&D procedure and prescribed pain medication.  Pt denies headache and fever. She currently smokes 0.5 packs per day.   Past Medical History  Diagnosis Date  . Abscess   . Rheumatic fever   . History of breast  abscess 2013   Past Surgical History  Procedure Laterality Date  . Incise and drain abcess      lt breast   Family History  Problem Relation Age of Onset  . Heart disease Paternal Grandmother    History  Substance Use Topics  . Smoking status: Current Every Day Smoker -- 0.50 packs/day    Types: Cigarettes  . Smokeless tobacco: Not on file  . Alcohol Use: Yes   OB History   Grav Para Term Preterm Abortions TAB SAB Ect Mult Living                 Review of Systems  Constitutional: Positive for diaphoresis.  Gastrointestinal: Positive for nausea and vomiting. Negative for diarrhea.  Skin: Positive for wound.       Right areola of the right breast  All other systems reviewed and are negative.    Allergies  Hydrocodone  Home Medications   Current Outpatient Rx  Name  Route  Sig  Dispense  Refill  . acetaminophen (TYLENOL) 500 MG tablet   Oral   Take 500 mg by mouth every 6 (six) hours as needed for pain.         Marland Kitchen ibuprofen (ADVIL,MOTRIN) 800 MG tablet   Oral   Take 1 tablet (800 mg total) by mouth every 8 (eight) hours as needed for pain.   30 tablet   0   . methocarbamol (ROBAXIN) 500 MG tablet   Oral   Take 1 tablet (500 mg  total) by mouth 2 (two) times daily.   20 tablet   0    BP 120/77  Pulse 88  Temp(Src) 98.8 F (37.1 C) (Oral)  Resp 18  LMP 11/05/2012 Physical Exam  Nursing note and vitals reviewed. Constitutional: She is oriented to person, place, and time. She appears well-developed and well-nourished. No distress.  HENT:  Head: Normocephalic and atraumatic.  Right Ear: External ear normal.  Left Ear: External ear normal.  Nose: Nose normal.  Mouth/Throat: Oropharynx is clear and moist.  Eyes: Conjunctivae are normal.  Neck: Normal range of motion.  Cardiovascular: Normal rate, regular rhythm and normal heart sounds.   Pulmonary/Chest: Effort normal and breath sounds normal. No stridor. No respiratory distress. She has no wheezes. She  has no rales.  Abdominal: Soft. She exhibits no distension.  Musculoskeletal: Normal range of motion.  Neurological: She is alert and oriented to person, place, and time. She has normal strength.  Skin: Skin is warm and dry. She is not diaphoretic. There is erythema.  3 cm are of induration with surrounding erythema on the right areola with extension medially. No nipple drainage at this time.   Psychiatric: She has a normal mood and affect. Her behavior is normal.    ED Course  Procedures (including critical care time)  COORDINATION OF CARE: 2:24 PM PM Discussed course of care with pt which includes ultrasound of area. Pt understands and agrees, but asks to be medicated prior to ultrasound.  Labs Review Labs Reviewed - No data to display Imaging Review No results found.  MDM   1. Cellulitis    Suspect uncomplicated cellulitis based on limited area of involvement, minimal pain, no systemic signs of illness (eg, fever, chills, dehydration, altered mental status, tachypnea, tachycardia, hypotension), no risk factors for serious illness (eg, extremes of age, general debility, immunocompromised status).   PE reveals redness, swelling, tenderness, warm to touch. Skin intact, No bleeding. No bullae. Non purulent. Non circumferential. No nipple drainage seen at this time. Borders are not elevated or sharply demarcated.  Preformed ultrasound and did not detect any occult abscess. Drew a line around the area of infection. Pt was instructed to return to the ED if area surpasses the boarder or pain intensifies.   Will prescribed Bactrim to cover for MRSA, as well as Keflex. Direct pt to apply warm compresses and to return to ED for I&D if pain should increase or abscess should develop. She was given referral to the breast center due to location of cellulitic area and hx of nipple discharge.      I personally performed the services described in this documentation, which was scribed in my  presence. The recorded information has been reviewed and is accurate.     Mora Bellman, PA-C 12/16/12 1749

## 2012-12-17 NOTE — ED Provider Notes (Signed)
Medical screening examination/treatment/procedure(s) were performed by non-physician practitioner and as supervising physician I was immediately available for consultation/collaboration.  Shon Baton, MD 12/17/12 [phone]

## 2013-02-20 ENCOUNTER — Encounter (HOSPITAL_COMMUNITY): Payer: Self-pay | Admitting: Emergency Medicine

## 2013-02-20 ENCOUNTER — Emergency Department (HOSPITAL_COMMUNITY)
Admission: EM | Admit: 2013-02-20 | Discharge: 2013-02-20 | Disposition: A | Discharge status: Home / Self Care | Payer: Self-pay | Attending: Emergency Medicine | Admitting: Emergency Medicine

## 2013-02-20 DIAGNOSIS — F17200 Nicotine dependence, unspecified, uncomplicated: Secondary | ICD-10-CM | POA: Insufficient documentation

## 2013-02-20 DIAGNOSIS — L0291 Cutaneous abscess, unspecified: Secondary | ICD-10-CM

## 2013-02-20 DIAGNOSIS — Z8679 Personal history of other diseases of the circulatory system: Secondary | ICD-10-CM | POA: Insufficient documentation

## 2013-02-20 DIAGNOSIS — IMO0002 Reserved for concepts with insufficient information to code with codable children: Secondary | ICD-10-CM | POA: Insufficient documentation

## 2013-02-20 MED ORDER — OXYCODONE-ACETAMINOPHEN 5-325 MG PO TABS: 2.0000 | ORAL_TABLET | ORAL | Status: DC | PRN

## 2013-02-20 MED ORDER — SULFAMETHOXAZOLE-TRIMETHOPRIM 800-160 MG PO TABS: 1.0000 | ORAL_TABLET | Freq: Two times a day (BID) | ORAL | Status: DC

## 2013-02-20 MED ORDER — OXYCODONE-ACETAMINOPHEN 5-325 MG PO TABS
2.0000 | ORAL_TABLET | Freq: Once | ORAL | Status: AC
  Administered 2013-02-20: 2 via ORAL
  Filled 2013-02-20: qty 2

## 2013-02-20 MED ORDER — MORPHINE SULFATE 4 MG/ML IJ SOLN
4.0000 mg | Freq: Once | INTRAMUSCULAR | Status: AC
  Administered 2013-02-20: 4 mg via INTRAMUSCULAR
  Filled 2013-02-20: qty 1

## 2013-02-20 MED ORDER — CEPHALEXIN 500 MG PO CAPS: 500.0000 mg | ORAL_CAPSULE | Freq: Four times a day (QID) | ORAL | Status: DC

## 2013-02-20 MED ORDER — DIAZEPAM 5 MG PO TABS
5.0000 mg | ORAL_TABLET | Freq: Once | ORAL | Status: AC
  Administered 2013-02-20: 5 mg via ORAL
  Filled 2013-02-20: qty 1

## 2013-02-20 NOTE — ED Provider Notes (Signed)
CSN: [id number]     Arrival date & time 02/20/13  1008 History  This chart was scribed for non-physician practitioner Armanda Magic working with Flint Melter, MD by Joaquin Music, ED Scribe. This patient was seen in room WTR6/WTR6 and the patient's care was started at 12:30 PM .   Chief Complaint  Patient presents with  . Wound Infection    Right Arm   Patient is a 42 y.o. female presenting with wound check. The history is provided by the patient. No language interpreter was used.  Wound Check This is a recurrent problem. The current episode started more than 2 days ago. The problem has not changed since onset.Exacerbated by: Palpitation. Nothing relieves the symptoms. She has tried nothing for the symptoms. The treatment provided no relief.   HPI Comments: Tamsen Reist is a 42 y.o. female with a hx of abscesses who presents to the Emergency Department complaining of abscess to R underarm/axillary area for the past week. Pt states she has been in pain and is tender to palpation. The pain is throbbing and severe without radiation. Pt denies fever, v/n/d. She has not tried anything for relief.    Past Medical History  Diagnosis Date  . Abscess   . Rheumatic fever   . History of breast abscess 2013   Past Surgical History  Procedure Laterality Date  . Incise and drain abcess      lt breast   Family History  Problem Relation Age of Onset  . Heart disease Paternal Grandmother    History  Substance Use Topics  . Smoking status: Current Every Day Smoker -- 0.50 packs/day    Types: Cigarettes  . Smokeless tobacco: Not on file  . Alcohol Use: Yes   OB History   Grav Para Term Preterm Abortions TAB SAB Ect Mult Living                 Review of Systems  Constitutional: Negative for fever.  Gastrointestinal: Negative for nausea, vomiting and diarrhea.  Skin: Positive for color change and wound.    Allergies  Hydrocodone  Home Medications   Current  Outpatient Rx  Name  Route  Sig  Dispense  Refill  . ibuprofen (ADVIL,MOTRIN) 200 MG tablet   Oral   Take 600 mg by mouth every 6 (six) hours as needed.          Triage Vitals:BP 144/80  Pulse 85  Temp(Src) 98.2 F (36.8 C) (Oral)  Resp 16  SpO2 98%  Physical Exam  Nursing note and vitals reviewed. Constitutional: She is oriented to person, place, and time. She appears well-developed and well-nourished. No distress.  HENT:  Head: Normocephalic and atraumatic.  Eyes: EOM are normal.  Neck: Neck supple. No tracheal deviation present.  Cardiovascular: Normal rate.   Pulmonary/Chest: Effort normal. No respiratory distress.  Musculoskeletal: Normal range of motion.  Neurological: She is alert and oriented to person, place, and time.  Skin: Skin is warm and dry.  Psychiatric: She has a normal mood and affect. Her behavior is normal.   ED Course  Procedures DIAGNOSTIC STUDIES: Oxygen Saturation is 98% on RA, normal by my interpretation.    COORDINATION OF CARE: 12:46 PM-Discussed treatment plan which includes Incision and Drainage. Pt agreed to plan.   INCISION AND DRAINAGE Performed by: Armanda Magic Consent: Verbal consent obtained. Risks and benefits: risks, benefits and alternatives were discussed Type: abscess  Body area: R underarm/axillary area  Anesthesia: local infiltration  Incision was made  with a scalpel.  Local anesthetic: lidocaine 1.0% without epinephrine  Anesthetic total: 1 ml  Complexity: complex Blunt dissection to break up loculations  Drainage: purulent  Drainage amount: 7 ml  Packing material: 1/4 in iodoform gauze  Patient tolerance: Patient tolerated the procedure well with no immediate complications.  1:03 PM-Applied Bacitracin to area and administered Valium while in ED. Will D/C pt with antibiotics and pain medications.   Labs Review Labs Reviewed - No data to display Imaging Review No results found.  EKG  Interpretation   None       MDM   1. Abscess and cellulitis    1:34 PM Abscess drained without complication. Patient will be discharged with pain medication and antibiotics. Vitals stable and patient afebrile.   I personally performed the services described in this documentation, which was scribed in my presence. The recorded information has been reviewed and is accurate.    Emilia Beck, PA-C 02/20/13 1337

## 2013-02-20 NOTE — ED Provider Notes (Signed)
Medical screening examination/treatment/procedure(s) were performed by non-physician practitioner and as supervising physician I was immediately available for consultation/collaboration.  Bensen Chadderdon L Nyquan Selbe, MD 02/20/13 1451 

## 2013-02-20 NOTE — Progress Notes (Signed)
[facility] provided pt with a list of primary care resources and information on [facility].  °

## 2013-04-04 ENCOUNTER — Encounter (HOSPITAL_COMMUNITY): Payer: Self-pay | Admitting: Emergency Medicine

## 2013-04-04 ENCOUNTER — Emergency Department (HOSPITAL_COMMUNITY)
Admission: EM | Admit: 2013-04-04 | Discharge: 2013-04-04 | Disposition: A | Discharge status: Home / Self Care | Payer: PRIVATE HEALTH INSURANCE | Attending: Emergency Medicine | Admitting: Emergency Medicine

## 2013-04-04 DIAGNOSIS — Z792 Long term (current) use of antibiotics: Secondary | ICD-10-CM | POA: Insufficient documentation

## 2013-04-04 DIAGNOSIS — F17200 Nicotine dependence, unspecified, uncomplicated: Secondary | ICD-10-CM | POA: Insufficient documentation

## 2013-04-04 DIAGNOSIS — N764 Abscess of vulva: Secondary | ICD-10-CM | POA: Insufficient documentation

## 2013-04-04 DIAGNOSIS — Z8679 Personal history of other diseases of the circulatory system: Secondary | ICD-10-CM | POA: Insufficient documentation

## 2013-04-04 MED ORDER — OXYCODONE-ACETAMINOPHEN 5-325 MG PO TABS: 2.0000 | ORAL_TABLET | ORAL | Status: DC | PRN

## 2013-04-04 MED ORDER — CLINDAMYCIN HCL 150 MG PO CAPS: 150.0000 mg | ORAL_CAPSULE | Freq: Four times a day (QID) | ORAL | Status: DC

## 2013-04-04 NOTE — ED Notes (Signed)
Patient states that she has pain to her vaginal area with abcess

## 2013-04-04 NOTE — Discharge Instructions (Signed)
Abscess  Care After  An abscess (also called a boil or furuncle) is an infected area that contains a collection of pus. Signs and symptoms of an abscess include pain, tenderness, redness, or hardness, or you may feel a moveable soft area under your skin. An abscess can occur anywhere in the body. The infection may spread to surrounding tissues causing cellulitis. A cut (incision) by the surgeon was made over your abscess and the pus was drained out. Gauze may have been packed into the space to provide a drain that will allow the cavity to heal from the inside outwards. The boil may be painful for 5 to 7 days. Most people with a boil do not have high fevers. Your abscess, if seen early, may not have localized, and may not have been lanced. If not, another appointment may be required for this if it does not get better on its own or with medications.  HOME CARE INSTRUCTIONS   · Only take over-the-counter or prescription medicines for pain, discomfort, or fever as directed by your caregiver.  · When you bathe, soak and then remove gauze or iodoform packs at least daily or as directed by your caregiver. You may then wash the wound gently with mild soapy water. Repack with gauze or do as your caregiver directs.  SEEK IMMEDIATE MEDICAL CARE IF:   · You develop increased pain, swelling, redness, drainage, or bleeding in the wound site.  · You develop signs of generalized infection including muscle aches, chills, fever, or a general ill feeling.  · An oral temperature above 102° F (38.9° C) develops, not controlled by medication.  See your caregiver for a recheck if you develop any of the symptoms described above. If medications (antibiotics) were prescribed, take them as directed.  Document Released: 09/21/2004 Document Revised: 05/28/2011 Document Reviewed: 05/19/2007  Exit[employer]® Patient Information ©2014 [employer].

## 2013-04-04 NOTE — ED Provider Notes (Signed)
Medical screening examination/treatment/procedure(s) were performed by non-physician practitioner and as supervising physician I was immediately available for consultation/collaboration.  EKG Interpretation   None         Ephraim Hamburger, MD 04/04/13 1800

## 2013-04-04 NOTE — ED Provider Notes (Signed)
CSN: [id number]     Arrival date & time 04/04/13  1620 History  This chart was scribed for non-physician practitioner, Domenic Moras, PA-C working with Ephraim Hamburger, MD Frederich Balding, ED scribe. This patient was seen in room WTR8/WTR8 and the patient's care was started at 4:35 PM.  Chief Complaint  Patient presents with  . Abscess   Patient is a 43 y.o. female presenting with abscess. The history is provided by the patient. No language interpreter was used.  Abscess Location:  Ano-genital Ano-genital abscess location:  Vagina Abscess quality: fluctuance, induration, painful and redness   Duration:  2 days Progression:  Unchanged Pain details:    Severity:  Moderate   Duration:  2 days   Timing:  Constant   Progression:  Unchanged Chronicity:  New Relieved by:  Nothing Ineffective treatments:  Warm water soaks Associated symptoms: no fever    HPI Comments: [name] is a 43 y.o. female with history of recurrent breast abscess who presents to the Emergency Department complaining of an abscess to her left labia with associated redness and tenderness that she noticed 2 days ago. Denies any drainage. She states she has done warm soaks with no relief. Denies fever.   Past Medical History  Diagnosis Date  . Abscess   . Rheumatic fever   . History of breast abscess 2013   Past Surgical History  Procedure Laterality Date  . Incise and drain abcess      lt breast   Family History  Problem Relation Age of Onset  . Heart disease Paternal Grandmother    History  Substance Use Topics  . Smoking status: Current Every Day Smoker -- 0.50 packs/day    Types: Cigarettes  . Smokeless tobacco: Not on file  . Alcohol Use: Yes   OB History   Grav Para Term Preterm Abortions TAB SAB Ect Mult Living                 Review of Systems  Constitutional: Negative for fever.  Skin: Positive for color change.       Abscess  All other systems reviewed and are negative.   Allergies   Hydrocodone  Home Medications   Current Outpatient Rx  Name  Route  Sig  Dispense  Refill  . cephALEXin (KEFLEX) 500 MG capsule   Oral   Take 1 capsule (500 mg total) by mouth 4 (four) times daily.   40 capsule   0   . ibuprofen (ADVIL,MOTRIN) 200 MG tablet   Oral   Take 600 mg by mouth every 6 (six) hours as needed.         Marland Kitchen oxyCODONE-acetaminophen (PERCOCET/ROXICET) 5-325 MG per tablet   Oral   Take 2 tablets by mouth every 4 (four) hours as needed for severe pain.   16 tablet   0   . sulfamethoxazole-trimethoprim (SEPTRA DS) 800-160 MG per tablet   Oral   Take 1 tablet by mouth every 12 (twelve) hours.   20 tablet   0    BP 124/74  Pulse 99  Temp(Src) 98.7 F (37.1 C) (Oral)  Resp 16  SpO2 100%  Physical Exam  Nursing note and vitals reviewed. Constitutional: She is oriented to person, place, and time. She appears well-developed and well-nourished. No distress.  HENT:  Head: Normocephalic and atraumatic.  Eyes: EOM are normal.  Neck: Neck supple. No tracheal deviation present.  Cardiovascular: Normal rate.   Pulmonary/Chest: Effort normal. No respiratory distress.  Genitourinary:  Chaperone present. Left labia majora with significant induration and fluctuance with exquisite tenderness on palpation. Multiple small vascular lesions noted to labia majora as well.   Musculoskeletal: Normal range of motion.  Neurological: She is alert and oriented to person, place, and time.  Skin: Skin is warm and dry.  Psychiatric: She has a normal mood and affect. Her behavior is normal.    ED Course  Procedures (including critical care time)  DIAGNOSTIC STUDIES: Oxygen Saturation is 100% on RA, normal by my interpretation.    COORDINATION OF CARE: 4:38 PM-Discussed treatment plan which includes I&D and an antibiotic with pt at bedside and pt agreed to plan.   INCISION AND DRAINAGE Performed by: Domenic Moras, PA-C Consent: Verbal consent obtained. Risks and  benefits: risks, benefits and alternatives were discussed Type: abscess  Body area: left labia  Anesthesia: local infiltration  Incision was made with a scalpel.  Local anesthetic: lidocaine 2% without epinephrine  Anesthetic total: 3 ml  Complexity: complex Blunt dissection to break up loculations  Drainage: purulent  Drainage amount: moderate  Packing material: none  Patient tolerance: Patient tolerated the procedure well with no immediate complications.   Labs Review Labs Reviewed - No data to display Imaging Review No results found.  EKG Interpretation   None       MDM   1. Left genital labial abscess    BP 124/74  Pulse 99  Temp(Src) 98.7 F (37.1 C) (Oral)  Resp 16  SpO2 100%   I personally performed the services described in this documentation, which was scribed in my presence. The recorded information has been reviewed and is accurate.    Domenic Moras, PA-C 04/04/13 1718

## 2013-09-30 ENCOUNTER — Emergency Department (HOSPITAL_COMMUNITY): Payer: No Typology Code available for payment source

## 2013-09-30 ENCOUNTER — Encounter (HOSPITAL_COMMUNITY): Payer: Self-pay | Admitting: Emergency Medicine

## 2013-09-30 ENCOUNTER — Emergency Department (HOSPITAL_COMMUNITY)
Admission: EM | Admit: 2013-09-30 | Discharge: 2013-09-30 | Disposition: A | Discharge status: Home / Self Care | Payer: No Typology Code available for payment source | Attending: Emergency Medicine | Admitting: Emergency Medicine

## 2013-09-30 DIAGNOSIS — Z3202 Encounter for pregnancy test, result negative: Secondary | ICD-10-CM | POA: Insufficient documentation

## 2013-09-30 DIAGNOSIS — N61 Mastitis without abscess: Secondary | ICD-10-CM | POA: Insufficient documentation

## 2013-09-30 DIAGNOSIS — F17200 Nicotine dependence, unspecified, uncomplicated: Secondary | ICD-10-CM | POA: Insufficient documentation

## 2013-09-30 MED ORDER — CLINDAMYCIN HCL 150 MG PO CAPS: 300.0000 mg | ORAL_CAPSULE | Freq: Three times a day (TID) | ORAL | Status: DC

## 2013-09-30 MED ORDER — OXYCODONE-ACETAMINOPHEN 5-325 MG PO TABS
1.0000 | ORAL_TABLET | Freq: Once | ORAL | Status: AC
Start: 2013-09-30 — End: 2013-09-30
  Administered 2013-09-30: 1 via ORAL
  Filled 2013-09-30: qty 1

## 2013-09-30 MED ORDER — CLINDAMYCIN HCL 300 MG PO CAPS
300.0000 mg | ORAL_CAPSULE | Freq: Once | ORAL | Status: AC
  Administered 2013-09-30: 300 mg via ORAL
  Filled 2013-09-30: qty 1

## 2013-09-30 MED ORDER — OXYCODONE-ACETAMINOPHEN 5-325 MG PO TABS
1.0000 | ORAL_TABLET | Freq: Once | ORAL | Status: AC
  Administered 2013-09-30: 1 via ORAL
  Filled 2013-09-30: qty 1

## 2013-09-30 MED ORDER — OXYCODONE-ACETAMINOPHEN 5-325 MG PO TABS: 2.0000 | ORAL_TABLET | ORAL | Status: DC | PRN

## 2013-09-30 NOTE — ED Provider Notes (Signed)
CSN: [id number]     Arrival date & time 09/30/13  0830 History   First MD Initiated Contact with Patient 09/30/13 1013     Chief Complaint  Patient presents with  . Breast Pain    left   HPI  Patient is a 43 y.o. Female with a history of recurrent abscesses who comes in with the complaint of left breast pain x 2 days.  Patient states that the pain is a constant 9/10 pain that is throbbing in quality and has no radiation.  Patient states that she has had an abscess in this location in the past.  Patient states that her pain is associated with left yellow nipple discharge at rest.  Patient has tried tylenol and ointment with no relief.  Patient denies any fever, nausea, vomiting, or redness.    Past Medical History  Diagnosis Date  . Abscess   . Rheumatic fever   . History of breast abscess 2013   Past Surgical History  Procedure Laterality Date  . Incise and drain abcess      lt breast   Family History  Problem Relation Age of Onset  . Heart disease Paternal Grandmother    History  Substance Use Topics  . Smoking status: Current Every Day Smoker -- 0.50 packs/day    Types: Cigarettes  . Smokeless tobacco: Not on file  . Alcohol Use: Yes   OB History   Grav Para Term Preterm Abortions TAB SAB Ect Mult Living                 Review of Systems  Constitutional: Negative for fever, chills and fatigue.  Respiratory: Negative for chest tightness and shortness of breath.   Cardiovascular: Negative for chest pain and palpitations.  Gastrointestinal: Negative for nausea and vomiting.  All other systems reviewed and are negative.     Allergies  Hydrocodone  Home Medications   Prior to Admission medications   Medication Sig Start Date End Date Taking? Authorizing Provider  acetaminophen (TYLENOL) 500 MG tablet Take 1,000 mg by mouth every 6 (six) hours as needed for mild pain.   Yes Historical Provider, MD  clindamycin (CLEOCIN) 150 MG capsule Take 2 capsules (300 mg total)  by mouth 3 (three) times daily. May dispense as 150mg  capsules 09/30/13   Alvina Chou, PA-C  oxyCODONE-acetaminophen (PERCOCET/ROXICET) 5-325 MG per tablet Take 2 tablets by mouth every 4 (four) hours as needed for moderate pain or severe pain. 09/30/13   Kaitlyn Szekalski, PA-C   BP 105/66  Pulse 65  Temp(Src) 98.1 F (36.7 C) (Oral)  Resp 16  SpO2 96%  LMP 09/30/2013 Physical Exam  Nursing note and vitals reviewed. Constitutional: She is oriented to person, place, and time. She appears well-developed and well-nourished. No distress.  HENT:  Head: Normocephalic and atraumatic.  Mouth/Throat: Oropharynx is clear and moist. No oropharyngeal exudate.  Eyes: Conjunctivae are normal. No scleral icterus.  Neck: Normal range of motion. Neck supple. No JVD present. No thyromegaly present.  Cardiovascular: Normal rate, regular rhythm, normal heart sounds and intact distal pulses.  Exam reveals no gallop and no friction rub.   No murmur heard. Pulmonary/Chest: Effort normal and breath sounds normal. No respiratory distress. She has no wheezes. She has no rales. Left breast exhibits mass and tenderness. Left breast exhibits no inverted nipple, no nipple discharge and no skin change. Breasts are asymmetrical.    Musculoskeletal: Normal range of motion.  Lymphadenopathy:    She has no cervical adenopathy.  Neurological: She is alert and oriented to person, place, and time.  Skin: Skin is warm and dry. She is not diaphoretic.  Psychiatric: She has a normal mood and affect. Her behavior is normal. Judgment and thought content normal.    ED Course  Procedures (including critical care time) Labs Review Labs Reviewed - No data to display  Imaging Review US Breast Ltd Uni Left Inc Axilla  09/30/2013   CLINICAL DATA:  Left breast tenderness, warmth and pain. History of breast abscess.  EXAM: ULTRASOUND OF THE left BREAST  COMPARISON:  None  FINDINGS: Ultrasound is performed, showing at the  left breast superior area lower area pain/redness is head oval hypoechoic lesion measuring 2.76 x 1.46 x 2.4 cm. Lesion has internal color flow. This lesion has and appearance of fibroadenoma and less likely to represent abscess. Note that this exam was scanned by ultrasound technologist at the [facility]  IMPRESSION: Probable benign findings. Oval hypoechoic lesion in the palpable area more likely represent fibroadenoma and less likely abscess.  RECOMMENDATION: Given the clinical symptoms of warmth, pain and tenderness, if the clinical physician feels the patient has sign and symptoms of mastitis, the patient should be treated for mastitis. The oval hypoechoic lesion at the palpable area should be correlated with mammographic findings on outpatient basis.  I have discussed the findings and recommendations with the patient. Results were also provided in writing at the conclusion of the visit. If applicable, a reminder letter will be sent to the patient regarding the next appointment.  BI-RADS CATEGORY  3: Probably benign finding(s) - short interval follow-up suggested.   Electronically Signed   By: Abelardo Diesel M.D.   On: 09/30/2013 13:45     EKG Interpretation None      MDM   Final diagnoses:  Acute mastitis of left breast   Patient is a 43 y.o. Female with history of abscesses which presents for left breast pain.  Patient has a history of breast abscesses.  Breast is extremely tender here and has some mild erythema.  Ultrasound of the breast shows fibroadenoma vs. Abscess.  Patient was moved to the back and the patient was taken over by [facility] for further care.  Patient was discharged home with clindamycin and percocet.      Cherylann Parr, PA-C 09/30/13 1506

## 2013-09-30 NOTE — ED Provider Notes (Signed)
2:08 PM Patient signed out to me by Starlyn Skeans, PA-C. Patient's US shows no abscess. Patient likely has mastitis of left breast. I did not observe any nipple discharge on my exam but patient's left upper out breast quadrant is extremely tender to palpation. Patient will have clindamycin and percocet here. Patient will be discharged with clindamycin and percocet for symptoms. Vitals stable and patient afebrile.   Results for orders placed during the hospital encounter of 06/22/11  POCT PREGNANCY, URINE      Result Value Ref Range   Preg Test, Ur NEGATIVE  NEGATIVE   US Breast Ltd Uni Left Inc Axilla  09/30/2013   CLINICAL DATA:  Left breast tenderness, warmth and pain. History of breast abscess.  EXAM: ULTRASOUND OF THE left BREAST  COMPARISON:  None  FINDINGS: Ultrasound is performed, showing at the left breast superior area lower area pain/redness is head oval hypoechoic lesion measuring 2.76 x 1.46 x 2.4 cm. Lesion has internal color flow. This lesion has and appearance of fibroadenoma and less likely to represent abscess. Note that this exam was scanned by ultrasound technologist at the [facility]  IMPRESSION: Probable benign findings. Oval hypoechoic lesion in the palpable area more likely represent fibroadenoma and less likely abscess.  RECOMMENDATION: Given the clinical symptoms of warmth, pain and tenderness, if the clinical physician feels the patient has sign and symptoms of mastitis, the patient should be treated for mastitis. The oval hypoechoic lesion at the palpable area should be correlated with mammographic findings on outpatient basis.  I have discussed the findings and recommendations with the patient. Results were also provided in writing at the conclusion of the visit. If applicable, a reminder letter will be sent to the patient regarding the next appointment.  BI-RADS CATEGORY  3: Probably benign finding(s) - short interval follow-up suggested.   Electronically Signed   By: Abelardo Diesel  M.D.   On: 09/30/2013 13:45      Alvina Chou, PA-C 09/30/13 1537

## 2013-09-30 NOTE — ED Notes (Signed)
Pt states couple days ago L breast started to become sore, states then started having yellow puss like drainage come from nipple and above nipple area, states has a hx of cyst in this breast. L Breast very painful to touch.

## 2013-09-30 NOTE — ED Notes (Signed)
Pt asked to talk to this writer and was upset that she had to wait for a provider and was upset that nurse was not "encouraging" when patient threatened to leave.  Pt was informed that we cannot force her to stay here and if she decides to leave then she certainly can do that.  Pt was given warm blanket by that nurse and pt had no further complaints.

## 2013-09-30 NOTE — Discharge Instructions (Signed)
Take Clindamycin as directed until gone. Refer to attache documents for more information. Return to the ED with worsening or concerning symptoms.    Emergency Department Resource Guide 1) Find a Doctor and Pay Out of Pocket Although you won't have to find out who is covered by your insurance plan, it is a good idea to ask around and get recommendations. You will then need to call the office and see if the doctor you have chosen will accept you as a new patient and what types of options they offer for patients who are self-pay. Some doctors offer discounts or will set up payment plans for their patients who do not have insurance, but you will need to ask so you aren't surprised when you get to your appointment.  2) Contact Your Local Health Department Not all health departments have doctors that can see patients for sick visits, but many do, so it is worth a call to see if yours does. If you don't know where your local health department is, you can check in your phone book. The CDC also has a tool to help you locate your state's health department, and many state websites also have listings of all of their local health departments.  3) Find a [facility] If your illness is not likely to be very severe or complicated, you may want to try a walk in clinic. These are popping up all over the country in pharmacies, drugstores, and shopping centers. They're usually staffed by nurse practitioners or physician assistants that have been trained to treat common illnesses and complaints. They're usually fairly quick and inexpensive. However, if you have serious medical issues or chronic medical problems, these are probably not your best option.  No Primary Care Doctor: - Call Health Connect at  [phone] - they can help you locate a primary care doctor that  accepts your insurance, provides certain services, etc. - Physician Referral Service- [phone]  Chronic Pain Problems: Organization          Address  Phone   Notes  [facility]  [phone] Patients need to be referred by their primary care doctor.   Medication Assistance: Organization         Address  Phone   Notes  [facility] Medication [facility] [address] [place], [place] [address] [phone] --Must be a resident of [place] -- Must have NO insurance coverage whatsoever (no Medicaid/ Medicare, etc.) -- The pt. MUST have a primary care doctor that directs their care regularly and follows them in the community   [facility]ist  [phone]   [employer]  [phone]    Agencies that provide inexpensive medical care: Organization         Address  Phone   Notes  [place]  [phone]   [facility]    [phone]   [facility] [place], [place] [address] [phone]   [place] [address], [place] [phone]   Planned Parenthood    [phone]   [facility]    [phone]   [place] and [place] [address], [place] Phone:  [phone], Fax:  [phone] Hours of Operation:  9 am - 6 pm, M-F.  Also accepts Medicaid/Medicare and self-pay.  [facility] for [place] [address], [employer] Phone: [phone]  [phone], Fax: [phone]. Hours of Operation:  8:30 am - 5:30 pm, M-F.  Also accepts Medicaid and self-pay.  [facility] [place] [address], [place] Phone: [phone]   [place], [place], [place] [phone], Ext. [phone] Mondays & Thursdays: 7-9 AM.  First 15 patients are seen on a first come, first serve basis.    [place] Providers:  Organization         Address  Phone   Notes  [facility] [address], [place] [phone] Also accepts self-pay patients.  [facility] [address], [place]  [phone]   [place], [address], [place] [phone]   [facility] [address] [place] [phone]   Lucianne Lei [address], [place]   [phone] Only accepts [place] Access [place] patients after they have their name applied to their card.   Self-Pay (no insurance) in [facility]:  Organization         Address  Phone   Notes  Sickle Cell Patients, [facility] [place] [phone]   [facility] [place] [phone]   [facility] [place]  [place], [place], [place] [phone]   [facility]/Dr. Osei-Bonsu  [address] [place] or [address], [place] [phone] Phone number for both [place] and [place] locations is the same.  Urgent [facility] and [facility] [address] [place] [phone]   [facility] [address], [place] or [address] [phone]   [facility] [address] [place] [phone], phone; [phone], fax Sees patients 1st and 3rd Saturday of every month.  Must not qualify for public or private insurance (i.e. Medicaid, Medicare, [place] Health Choice, Veterans' Benefits)  Household income should be no more than 200% of the poverty level The clinic cannot treat you if you are pregnant or think you are pregnant  Sexually transmitted diseases are not treated at the clinic.    Dental Care: Organization         Address  Phone  Notes  [facility] [place] [phone] Accepts children up to age 23 who are enrolled in [place] or [place]; pregnant women with a Medicaid card; and  children who have applied for Medicaid or [place] Health Choice, but were declined, whose parents can pay a reduced fee at time of service.  [facility] of [facility]  [address], [place] [phone] Accepts children up to age 22 who are enrolled in [place] or [place]; pregnant women with a Medicaid card; and children who have applied for Medicaid or [place] Health Choice, but were declined, whose parents can pay a reduced fee at time of service.  [facility]  [place] [phone] Patients are seen by appointment only. Walk-ins are not accepted. [place] will see patients 70 years of age and older. Monday - Tuesday (8am-5pm) Most Wednesdays (8:30-5pm) $30 per visit, cash only  [facility]  [address], [employer]  [place] [phone] Patients are seen by appointment only. Walk-ins are not accepted. [facility] will see patients 18 years of age and older. °One Wednesday Evening (Monthly: Volunteer Based).  $30 per visit, cash only  °[facility]  [phone] for adults; Children under age 4, call Graduate Pediatric Dentistry at [phone]. Children aged 4-14, please call [phone] to request a pediatric application. ° Dental services are provided in all areas of dental care including fillings, crowns and bridges, complete and partial dentures, implants, gum treatment, root canals, and extractions. Preventive care is also provided. Treatment is provided to both adults and children. °Patients are selected via a lottery and there is often a waiting list. °  °[facility] [address], °[place] ° ([phone] www.drcivils.com °  °[facility] [address], [place], [place] [phone], Ext. [phone] Second and Fourth Thursday of each month, opens at 6:30 AM; Clinic ends at 9 AM.  Patients are seen on a first-come first-served  basis, and a limited number are seen during each clinic.  ° °[facility] ° [address], [place], [place] [phone]   Eligibility Requirements °You must have lived in [place], [place], or [place] counties for at least the last three months. °  You cannot be eligible for state or federal sponsored healthcare insurance, including Veterans Administration, Medicaid, or Medicare. °  You generally cannot be eligible for healthcare insurance through your employer.  °  How to apply: °Eligibility screenings are held every Tuesday and Wednesday afternoon from 1:00 pm until 4:00 pm. You do not need an appointment for the interview!  °[facility] [address], [place], [place] [phone]   °[facility]  [phone]   °[facility]  [phone]   °[facility]  336-570-6415   ° °Behavioral Health Resources in the Community: °Intensive Outpatient Programs °Organization         Address  Phone  Notes  °[facility] [address], [place], [place] [phone]   °[facility] [address], [place], [place] [phone]   °[facility] [address], [place], [place] ° [phone]   °[facility] [address],  °[place], [place] 1-800-853-5163 or 336-641-4981   °Substance Abuse Resources °Organization         Address  Phone  Notes  °[facility]  336-882-2125   °[facility]  336-784-9470   °[facility]  336-285-9073   °[facility]  336-845-3988   °Residential & Outpatient Substance Abuse Program  1-800-659-3381   °Psychological Services °Organization         Address  Phone  Notes  °[facility]  336- [phone]   °[facility]  336- 378-7881   °[facility] [address], [place] 1-800-853-5163 or 336-641-4981   ° °Mobile Crisis Teams °Organization          Address  Phone  Notes  °[facility], Mobile Crisis Care Unit  1-877-626-1772   °Assertive °Psychotherapeutic Services ° [address] [place], [place] 336-834-9664   °Sharon DeEsch [address] °[place] 336-554-5454   ° °Self-Help/Support Groups °Organization         Address  Phone             Notes  °[facility] [place] - variety of support groups    336- H3156881 Call for more information  Narcotics Anonymous (NA), [facility] [address], [employer] [place]  2 meetings at this location   Residential Facilities manager         Address  Phone  Notes  [facility] [place],    [place]  [phone]   [facility]  [address], [place] [id number], [place], [place]   [place], [place] [phone] Admissions: 8am-3pm M-F  Incentives Substance [place] [address]    [employer], [place] [phone]   [facility] [address] [place], [place], [place]   [facility] [address],  [place], [place]   [facility] - Intensive Outpatient [address], [place], [place]   [facility] ([place].,  [place], [place] 1-(336)590-7905 or [phone]   [facility] ([facility]) [address], [place], [place] Accepts Medicaid  [facility] [address]  [place] [phone]-(661)[phone] Substance Abuse/Addiction Treatment   [facility] Organization         Address  Phone  Notes  [facility]  [phone]   Domenic Schwab, PhD [address] Arlis Port[place], [place]   [phone] or [phone]   [place], [place] [phone]   [facility] [address], [place], [place] [phone] Insurance/Medicaid/sponsorship  through [facility] [address]                                    [place], [place] [phone] [place] [address], [place] [phone]    Dr. Adele Schilder  [phone]   [facility] Dept. 1) [address] [place] 2) [place] 3)  [place] [address], [place] [phone]  [phone]   [place] [phone] or [phone] (After Hours)

## 2013-10-01 ENCOUNTER — Emergency Department (HOSPITAL_COMMUNITY)
Admission: EM | Admit: 2013-10-01 | Discharge: 2013-10-01 | Disposition: A | Discharge status: Home / Self Care | Payer: No Typology Code available for payment source | Attending: Emergency Medicine | Admitting: Emergency Medicine

## 2013-10-01 ENCOUNTER — Encounter (HOSPITAL_COMMUNITY): Payer: Self-pay | Admitting: Emergency Medicine

## 2013-10-01 DIAGNOSIS — Z8679 Personal history of other diseases of the circulatory system: Secondary | ICD-10-CM | POA: Insufficient documentation

## 2013-10-01 DIAGNOSIS — N6320 Unspecified lump in the left breast, unspecified quadrant: Secondary | ICD-10-CM

## 2013-10-01 DIAGNOSIS — F17200 Nicotine dependence, unspecified, uncomplicated: Secondary | ICD-10-CM | POA: Insufficient documentation

## 2013-10-01 DIAGNOSIS — N6019 Diffuse cystic mastopathy of unspecified breast: Secondary | ICD-10-CM | POA: Insufficient documentation

## 2013-10-01 DIAGNOSIS — Z792 Long term (current) use of antibiotics: Secondary | ICD-10-CM | POA: Insufficient documentation

## 2013-10-01 DIAGNOSIS — N6012 Diffuse cystic mastopathy of left breast: Secondary | ICD-10-CM

## 2013-10-01 DIAGNOSIS — N63 Unspecified lump in unspecified breast: Secondary | ICD-10-CM | POA: Insufficient documentation

## 2013-10-01 MED ORDER — HYDROMORPHONE HCL PF 1 MG/ML IJ SOLN
1.0000 mg | Freq: Once | INTRAMUSCULAR | Status: AC
  Administered 2013-10-01: 1 mg via INTRAMUSCULAR
  Filled 2013-10-01: qty 1

## 2013-10-01 NOTE — ED Provider Notes (Signed)
Medical screening examination/treatment/procedure(s) were performed by non-physician practitioner and as supervising physician I was immediately available for consultation/collaboration.   Leota Jacobsen, MD 10/01/13 2252

## 2013-10-01 NOTE — ED Provider Notes (Signed)
Medical screening examination/treatment/procedure(s) were performed by non-physician practitioner and as supervising physician I was immediately available for consultation/collaboration.    Dorie Rank, MD 10/01/13 [phone]

## 2013-10-01 NOTE — Discharge Instructions (Signed)
Mastitis Mastitis is inflammation of the breast tissue. It occurs most often in women who are breastfeeding, but it can also affect other women, and even sometimes men. CAUSES  Mastitis is usually caused by a bacterial infection. Bacteria enter the breast tissue through cuts or openings in the skin. Typically, this occurs with breastfeeding because of cracked or irritated skin. Sometimes, it can occur even when there is no opening in the skin. It can be associated with plugged milk (lactiferous) ducts. Nipple piercing can also lead to mastitis. Also, some forms of breast cancer can cause mastitis. SIGNS AND SYMPTOMS   Swelling, redness, tenderness, and pain in an area of the breast.  Swelling of the glands under the arm on the same side.  Fever. If an infection is allowed to progress, a collection of pus (abscess) may develop. DIAGNOSIS  Your health care provider can usually diagnose mastitis based on your symptoms and a physical exam. Tests may be done to help confirm the diagnosis. These may include:   Removal of pus from the breast by applying pressure to the area. This pus can be examined in the lab to determine which bacteria are present. If an abscess has developed, the fluid in the abscess can be removed with a needle. This can also be used to confirm the diagnosis and determine the bacteria present. In most cases, pus will not be present.  Blood tests to determine if your body is fighting a bacterial infection.  Mammogram or ultrasound tests to rule out other problems or diseases. TREATMENT  Antibiotic medicine is used to treat a bacterial infection. Your health care provider will determine which bacteria are most likely causing the infection and will select an appropriate antibiotic. This is sometimes changed based on the results of tests performed to identify the bacteria, or if there is no response to the antibiotic selected. Antibiotics are usually given by mouth. You may also be  given medicine for pain. Mastitis that occurs with breastfeeding will sometimes go away on its own, so your health care provider may choose to wait 24 hours after first seeing you to decide whether a prescription medicine is needed. HOME CARE INSTRUCTIONS   Only take over-the-counter or prescription medicines for pain, fever, or discomfort as directed by your health care provider.  If your health care provider prescribed an antibiotic, take the medicine as directed. Make sure you finish it even if you start to feel better.  Do not wear a tight or underwire bra. Wear a soft, supportive bra.  Increase your fluid intake, especially if you have a fever.  Women who are breastfeeding should follow these instructions:  Continue to empty the breast. Your health care provider can tell you whether this milk is safe for your infant or needs to be thrown out. You may be told to stop nursing until your health care provider thinks it is safe for your baby. Use a breast pump if you are advised to stop nursing.  Keep your nipples clean and dry.  Empty the first breast completely before going to the other breast. If your baby is not emptying your breasts completely for some reason, use a breast pump to empty your breasts.  If you go back to work, pump your breasts while at work to stay in time with your nursing schedule.  Avoid allowing your breasts to become overly filled with milk (engorged). SEEK MEDICAL CARE IF:   You have pus-like discharge from the breast.  Your symptoms do not   improve with the treatment prescribed by your health care provider within 2 days. SEEK IMMEDIATE MEDICAL CARE IF:   Your pain and swelling are getting worse.  You have pain that is not controlled with medicine.  You have a red line extending from the breast toward your armpit.  You have a fever or persistent symptoms for more than 2-3 days.  You have a fever and your symptoms suddenly get worse. Document Released:  03/05/2005 Document Revised: 03/10/2013 Document Reviewed: 10/03/2012 ExitCare Patient Information 2015 [employer]. This information is not intended to replace advice given to you by your health care provider. Make sure you discuss any questions you have with your health care provider.  

## 2013-10-01 NOTE — ED Notes (Signed)
Pt was seen yesterday for the c/o of left areola pain, discharged with diagnosis of breast mastitis, states pt states symptoms of throbbing pain on the affected, reports hardening of the affected breast although pt started on abx. Stated pain medication not helping.

## 2013-10-01 NOTE — ED Provider Notes (Signed)
CSN: [id number]     Arrival date & time 10/01/13  1612 History  This chart was scribed for Starlyn Skeans, PA-C, non-physician practitioner working with Leota Jacobsen, MD by Vernell Barrier, ED scribe. This patient was seen in room WTR8/WTR8 and the patient's care was started at 4:59 PM.  Chief Complaint  Patient presents with  . Breast Pain    Probable breast mastitis    The history is provided by the patient. No language interpreter was used.   HPI Comments: [name] is a 43 y.o. female who presents to the Emergency Department complaining of gradually worsening left breast pain primarily at the areola. States area has since gotten more red, tissue around that are has gotten harder, and pain is worsening. States today when she lifts her left arm she feels a pulling pain. Seen here 1 day prior and diagnosed with breast mastitis. Treated with Clindamycin which pt states she has been compliant with but there has been no signs of resolving sxs. Also started Percocet but reports no relief. States she took 3 upon discharge and 3 more later that night yesterday. Last took 1 pill this morning.  Past Medical History  Diagnosis Date  . Abscess   . Rheumatic fever   . History of breast abscess 2013   Past Surgical History  Procedure Laterality Date  . Incise and drain abcess      lt breast   Family History  Problem Relation Age of Onset  . Heart disease Paternal Grandmother    History  Substance Use Topics  . Smoking status: Current Every Day Smoker -- 0.50 packs/day    Types: Cigarettes  . Smokeless tobacco: Not on file  . Alcohol Use: Yes   OB History   Grav Para Term Preterm Abortions TAB SAB Ect Mult Living                 Review of Systems  Constitutional: Negative for fever, chills and fatigue.  Respiratory: Negative for chest tightness and shortness of breath.   Gastrointestinal: Negative for nausea and vomiting.  Musculoskeletal: Positive for myalgias.  Skin:  Positive for color change.  All other systems reviewed and are negative.  Allergies  Hydrocodone  Home Medications   Prior to Admission medications   Medication Sig Start Date End Date Taking? Authorizing Provider  acetaminophen (TYLENOL) 500 MG tablet Take 1,000 mg by mouth every 6 (six) hours as needed for mild pain.   Yes Historical Provider, MD  clindamycin (CLEOCIN) 150 MG capsule Take 2 capsules (300 mg total) by mouth 3 (three) times daily. May dispense as 150mg  capsules 09/30/13  Yes Kaitlyn Szekalski, PA-C  oxyCODONE-acetaminophen (PERCOCET/ROXICET) 5-325 MG per tablet Take 2 tablets by mouth every 4 (four) hours as needed for moderate pain or severe pain. 09/30/13  Yes Alvina Chou, PA-C   Triage vitals: BP 127/76  Pulse 90  Temp(Src) 99.5 F (37.5 C) (Oral)  Resp 18  Ht 5\' 6"  (1.676 m)  Wt 162 lb (73.483 kg)  BMI 26.16 kg/m2  SpO2 97%  LMP 09/30/2013  Physical Exam  Nursing note and vitals reviewed. Constitutional: She is oriented to person, place, and time. She appears well-developed and well-nourished. No distress.  HENT:  Head: Normocephalic and atraumatic.  Eyes: Conjunctivae and EOM are normal.  Neck: Neck supple. No tracheal deviation present.  Cardiovascular: Normal rate, regular rhythm and normal heart sounds.  Exam reveals no gallop and no friction rub.   No murmur heard. Pulmonary/Chest: Effort  normal and breath sounds normal. No respiratory distress. She has no wheezes. She has no rales. Left breast exhibits skin change and tenderness. Left breast exhibits no inverted nipple, no mass and no nipple discharge. Breasts are asymmetrical.    Musculoskeletal: Normal range of motion.  Neurological: She is alert and oriented to person, place, and time.  Skin: Skin is warm and dry.  Psychiatric: She has a normal mood and affect. Her behavior is normal.   ED Course  Procedures (including critical care time) DIAGNOSTIC STUDIES: Oxygen Saturation is 97% on  room air, normal by my interpretation.    COORDINATION OF CARE: At 5:06 PM: Discussed treatment plan with patient which includes pain medication. Pt advised she needs to follow up with an OB/GYN. Patient agrees.   Labs Review Labs Reviewed - No data to display  Imaging Review US Breast Ltd Uni Left Inc [place]  09/30/2013   CLINICAL DATA:  Left breast tenderness, warmth and pain. History of breast abscess.  EXAM: ULTRASOUND OF THE left BREAST  COMPARISON:  None  FINDINGS: Ultrasound is performed, showing at the left breast superior area lower area pain/redness is head oval hypoechoic lesion measuring 2.76 x 1.46 x 2.4 cm. Lesion has internal color flow. This lesion has and appearance of fibroadenoma and less likely to represent abscess. Note that this exam was scanned by ultrasound technologist at the [facility]  IMPRESSION: Probable benign findings. Oval hypoechoic lesion in the palpable area more likely represent fibroadenoma and less likely abscess.  RECOMMENDATION: Given the clinical symptoms of warmth, pain and tenderness, if the clinical physician feels the patient has sign and symptoms of mastitis, the patient should be treated for mastitis. The oval hypoechoic lesion at the palpable area should be correlated with mammographic findings on outpatient basis.  I have discussed the findings and recommendations with the patient. Results were also provided in writing at the conclusion of the visit. If applicable, a reminder letter will be sent to the patient regarding the next appointment.  BI-RADS CATEGORY  3: Probably benign finding(s) - short interval follow-up suggested.   Electronically Signed   By: Abelardo Diesel M.D.   On: 09/30/2013 13:45     EKG Interpretation None      MDM   Final diagnoses:  Mastitis chronic, left  Mass of left breast   Patient presents with left breast pain.  She was seen yesterday for the same.  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  Patient continues to have pain at this time.  Have given her a shot of dilaudid here in the ED.  Patient was encouraged to take her antibiotic until it was gone.  Patient was told she would not be given any more pain medication at this time.  Patient will be given follow-up information to be seen at the [facility] outpatient facility for further workup.  Patient understands and agrees with the above plan.  I personally performed the services described in this documentation, which was scribed in my presence. The recorded information has been reviewed and is accurate.     Cherylann Parr, PA-C 10/01/13 1830

## 2013-10-02 ENCOUNTER — Other Ambulatory Visit: Payer: Self-pay | Admitting: Family Medicine

## 2013-10-02 ENCOUNTER — Ambulatory Visit
Admission: RE | Admit: 2013-10-02 | Discharge: 2013-10-02 | Disposition: A | Discharge status: Home / Self Care | Payer: No Typology Code available for payment source | Source: Ambulatory Visit | Attending: Family Medicine | Admitting: Family Medicine

## 2013-10-02 ENCOUNTER — Encounter: Payer: Self-pay | Admitting: Family Medicine

## 2013-10-02 ENCOUNTER — Ambulatory Visit (INDEPENDENT_AMBULATORY_CARE_PROVIDER_SITE_OTHER): Payer: No Typology Code available for payment source | Admitting: Family Medicine

## 2013-10-02 VITALS — BP 116/77 | HR 82 | Temp 98.2°F | Ht 66.0 in | Wt 177.5 lb

## 2013-10-02 DIAGNOSIS — N61 Mastitis without abscess: Secondary | ICD-10-CM | POA: Insufficient documentation

## 2013-10-02 DIAGNOSIS — N6459 Other signs and symptoms in breast: Secondary | ICD-10-CM

## 2013-10-02 DIAGNOSIS — N6012 Diffuse cystic mastopathy of left breast: Secondary | ICD-10-CM

## 2013-10-02 MED ORDER — SULFAMETHOXAZOLE-TMP DS 800-160 MG PO TABS: 1.0000 | ORAL_TABLET | Freq: Two times a day (BID) | ORAL | Status: DC

## 2013-10-02 MED ORDER — OXYCODONE-ACETAMINOPHEN 5-325 MG PO TABS: 2.0000 | ORAL_TABLET | ORAL | Status: DC | PRN

## 2013-10-02 MED ORDER — OXYCODONE-ACETAMINOPHEN 5-325 MG PO TABS
2.0000 | ORAL_TABLET | ORAL | Status: DC | PRN
Start: 2013-10-02 — End: 2013-10-02

## 2013-10-02 NOTE — Addendum Note (Signed)
Addended by: Michel Harrow on: 10/02/2013 11:43 AM   Modules accepted: Orders

## 2013-10-02 NOTE — Progress Notes (Signed)
Subjective:     Patient ID: [name], female   DOB: 07/02/1970, 43 y.o.   MRN: [id number]  HPI Left breast abscess: seen at [facility] yesterday, underwent breast sono, rx'd percocet and referred to [facility].  Day 3 of clindamycin 300 TID.  Take 2-3 percocet every 2-3 hours, has 6 pills left from last rx.  Hx of abscess in same location twice before in 2013 same location requiring I&D.  Last Mammogram over a year ago, no hx of abn mammogram, no fam hx of breast cancer. 1/2PPDx25y, G0P0, regular menses, no current contraception, no hx of HRT.  Review of Systems  Constitutional: Negative for fever, chills, diaphoresis and fatigue.  Cardiovascular: Negative for chest pain.  Musculoskeletal:       Chest wall tenderness  Skin: Positive for color change. Negative for pallor and rash.       Objective:   Physical Exam  Constitutional: She appears well-developed and well-nourished. She appears distressed (2/2 pain).  HENT:  Head: Normocephalic and atraumatic.  Neck: Normal range of motion.  Pulmonary/Chest: Effort normal. No respiratory distress. She exhibits no mass, no crepitus and no retraction. Right breast exhibits no inverted nipple, no mass, no nipple discharge and no skin change. Left breast exhibits skin change (2 I&D scars superior to nipple 1.5cm) and tenderness (exquisite, limiting exam). Left breast exhibits no inverted nipple, no mass and no nipple discharge.    Abdominal: She exhibits no distension.  Skin: She is not diaphoretic.   BP 116/77   Pulse 82   Temp(Src) 98.2 F (36.8 C) (Oral)   Ht 5\' 6"  (1.676 m)   Wt 177 lb 8 oz (80.513 kg)   BMI 28.66 kg/m2   LMP 09/28/2013     Assessment & plan     Montoya was seen today for mastitis  Diagnoses and associated orders for this visit:  Mastitis - MM Digital Diagnostic Bilat; Future - STAT: US BREAST LTD UNI LEFT INC AXILLA; Future to eval for development of abscess given interim worsening while on clindamycin - RF:  oxyCODONE-acetaminophen (PERCOCET/ROXICET) 5-325 MG per tablet; Take 2 tablets by mouth every 4 (four) hours as needed for moderate pain or severe pain. Rx signed by Dr. Olevia Bowens, printed twice in error - START: sulfamethoxazole-trimethoprim (BACTRIM DS) 800-160 MG per tablet; Take 1 tablet by mouth 2 (two) times daily, continue clindamycin - appointment made with breast clinic for 10/08/13, wound check on Monday

## 2013-10-05 ENCOUNTER — Ambulatory Visit: Payer: PRIVATE HEALTH INSURANCE

## 2013-10-05 ENCOUNTER — Observation Stay (HOSPITAL_COMMUNITY): Payer: No Typology Code available for payment source | Admitting: Certified Registered Nurse Anesthetist

## 2013-10-05 ENCOUNTER — Encounter (HOSPITAL_COMMUNITY): Payer: Self-pay | Admitting: Emergency Medicine

## 2013-10-05 ENCOUNTER — Encounter (HOSPITAL_COMMUNITY): Payer: No Typology Code available for payment source | Admitting: Certified Registered Nurse Anesthetist

## 2013-10-05 ENCOUNTER — Observation Stay (HOSPITAL_COMMUNITY)
Admission: EM | Admit: 2013-10-05 | Discharge: 2013-10-06 | Disposition: A | Discharge status: Home / Self Care | Payer: No Typology Code available for payment source | Attending: General Surgery | Admitting: General Surgery

## 2013-10-05 ENCOUNTER — Encounter (HOSPITAL_COMMUNITY)
Admission: EM | Disposition: A | Discharge status: Home / Self Care | Payer: Self-pay | Source: Home / Self Care | Attending: Emergency Medicine

## 2013-10-05 DIAGNOSIS — N611 Abscess of the breast and nipple: Secondary | ICD-10-CM | POA: Diagnosis present

## 2013-10-05 DIAGNOSIS — N6089 Other benign mammary dysplasias of unspecified breast: Secondary | ICD-10-CM | POA: Insufficient documentation

## 2013-10-05 DIAGNOSIS — F17200 Nicotine dependence, unspecified, uncomplicated: Secondary | ICD-10-CM | POA: Insufficient documentation

## 2013-10-05 DIAGNOSIS — D72829 Elevated white blood cell count, unspecified: Secondary | ICD-10-CM | POA: Insufficient documentation

## 2013-10-05 DIAGNOSIS — N61 Mastitis without abscess: Principal | ICD-10-CM | POA: Insufficient documentation

## 2013-10-05 HISTORY — PX: IRRIGATION AND DEBRIDEMENT ABSCESS: SHX5252

## 2013-10-05 LAB — CBC WITH DIFFERENTIAL/PLATELET
BASOS ABS: 0 10*3/uL (ref 0.0–0.1)
Basophils Relative: 0 % (ref 0–1)
EOS PCT: 2 % (ref 0–5)
Eosinophils Absolute: 0.1 10*3/uL (ref 0.0–0.7)
HEMATOCRIT: 40.6 % (ref 36.0–46.0)
Hemoglobin: 13.9 g/dL (ref 12.0–15.0)
LYMPHS ABS: 1.7 10*3/uL (ref 0.7–4.0)
Lymphocytes Relative: 25 % (ref 12–46)
MCH: 32 pg (ref 26.0–34.0)
MCHC: 34.2 g/dL (ref 30.0–36.0)
MCV: 93.3 fL (ref 78.0–100.0)
Monocytes Absolute: 0.6 10*3/uL (ref 0.1–1.0)
Monocytes Relative: 9 % (ref 3–12)
NEUTROS ABS: 4.4 10*3/uL (ref 1.7–7.7)
Neutrophils Relative %: 64 % (ref 43–77)
PLATELETS: 272 10*3/uL (ref 150–400)
RBC: 4.35 MIL/uL (ref 3.87–5.11)
RDW: 14.1 % (ref 11.5–15.5)
WBC: 6.8 10*3/uL (ref 4.0–10.5)

## 2013-10-05 LAB — I-STAT CHEM 8, ED
BUN: 8 mg/dL (ref 6–23)
CALCIUM ION: 1.21 mmol/L (ref 1.12–1.23)
CHLORIDE: 106 meq/L (ref 96–112)
Creatinine, Ser: 0.9 mg/dL (ref 0.50–1.10)
Glucose, Bld: 104 mg/dL — ABNORMAL HIGH (ref 70–99)
HEMATOCRIT: 45 % (ref 36.0–46.0)
Hemoglobin: 15.3 g/dL — ABNORMAL HIGH (ref 12.0–15.0)
Potassium: 4.2 mEq/L (ref 3.7–5.3)
Sodium: 140 mEq/L (ref 137–147)
TCO2: 20 mmol/L (ref 0–100)

## 2013-10-05 LAB — GLUCOSE, CAPILLARY: Glucose-Capillary: 143 mg/dL — ABNORMAL HIGH (ref 70–99)

## 2013-10-05 SURGERY — IRRIGATION AND DEBRIDEMENT ABSCESS
Anesthesia: General | Site: Breast | Laterality: Left

## 2013-10-05 MED ORDER — MIDAZOLAM HCL 2 MG/2ML IJ SOLN
INTRAMUSCULAR | Status: AC
  Filled 2013-10-05: qty 2

## 2013-10-05 MED ORDER — PROPOFOL 10 MG/ML IV BOLUS
INTRAVENOUS | Status: AC
  Filled 2013-10-05: qty 20

## 2013-10-05 MED ORDER — SODIUM CHLORIDE 0.9 % IR SOLN
Status: DC | PRN
  Administered 2013-10-05: 1000 mL

## 2013-10-05 MED ORDER — FENTANYL CITRATE 0.05 MG/ML IJ SOLN
INTRAMUSCULAR | Status: AC
  Filled 2013-10-05: qty 2

## 2013-10-05 MED ORDER — FENTANYL CITRATE 0.05 MG/ML IJ SOLN
INTRAMUSCULAR | Status: DC | PRN
  Administered 2013-10-05 (×4): 50 ug via INTRAVENOUS

## 2013-10-05 MED ORDER — DIPHENHYDRAMINE HCL 50 MG/ML IJ SOLN: 12.5000 mg | Freq: Four times a day (QID) | INTRAMUSCULAR | Status: DC | PRN

## 2013-10-05 MED ORDER — PROMETHAZINE HCL 25 MG/ML IJ SOLN: 6.2500 mg | INTRAMUSCULAR | Status: DC | PRN

## 2013-10-05 MED ORDER — OXYCODONE HCL 5 MG PO TABS
5.0000 mg | ORAL_TABLET | ORAL | Status: DC | PRN
  Administered 2013-10-05: 10 mg via ORAL
  Filled 2013-10-05: qty 2

## 2013-10-05 MED ORDER — LACTATED RINGERS IV SOLN
INTRAVENOUS | Status: DC | PRN
  Administered 2013-10-05: 14:00:00 via INTRAVENOUS

## 2013-10-05 MED ORDER — HYDROMORPHONE HCL PF 1 MG/ML IJ SOLN
1.0000 mg | Freq: Once | INTRAMUSCULAR | Status: AC
  Administered 2013-10-05: 1 mg via INTRAVENOUS
  Filled 2013-10-05: qty 1

## 2013-10-05 MED ORDER — LACTATED RINGERS IV SOLN: INTRAVENOUS | Status: DC

## 2013-10-05 MED ORDER — BUPIVACAINE HCL 0.25 % IJ SOLN
INTRAMUSCULAR | Status: DC | PRN
  Administered 2013-10-05: 5 mL

## 2013-10-05 MED ORDER — LIDOCAINE HCL (CARDIAC) 20 MG/ML IV SOLN
INTRAVENOUS | Status: DC | PRN
  Administered 2013-10-05: 100 mg via INTRAVENOUS

## 2013-10-05 MED ORDER — POTASSIUM CHLORIDE IN NACL 20-0.9 MEQ/L-% IV SOLN
INTRAVENOUS | Status: DC
  Administered 2013-10-05 – 2013-10-06 (×2): via INTRAVENOUS
  Filled 2013-10-05 (×3): qty 1000

## 2013-10-05 MED ORDER — LIDOCAINE HCL (CARDIAC) 20 MG/ML IV SOLN
INTRAVENOUS | Status: AC
  Filled 2013-10-05: qty 5

## 2013-10-05 MED ORDER — OXYCODONE-ACETAMINOPHEN 5-325 MG PO TABS
2.0000 | ORAL_TABLET | ORAL | Status: DC | PRN
  Administered 2013-10-06: 2 via ORAL
  Filled 2013-10-05: qty 2

## 2013-10-05 MED ORDER — ONDANSETRON HCL 4 MG/2ML IJ SOLN
INTRAMUSCULAR | Status: AC
  Filled 2013-10-05: qty 2

## 2013-10-05 MED ORDER — PROPOFOL 10 MG/ML IV BOLUS
INTRAVENOUS | Status: DC | PRN
  Administered 2013-10-05: 100 mg via INTRAVENOUS
  Administered 2013-10-05: 200 mg via INTRAVENOUS

## 2013-10-05 MED ORDER — MORPHINE SULFATE 2 MG/ML IJ SOLN
2.0000 mg | INTRAMUSCULAR | Status: DC | PRN
  Administered 2013-10-05 – 2013-10-06 (×6): 2 mg via INTRAVENOUS
  Filled 2013-10-05 (×7): qty 1

## 2013-10-05 MED ORDER — DIPHENHYDRAMINE HCL 12.5 MG/5ML PO ELIX: 12.5000 mg | ORAL_SOLUTION | Freq: Four times a day (QID) | ORAL | Status: DC | PRN

## 2013-10-05 MED ORDER — HYDROMORPHONE HCL PF 1 MG/ML IJ SOLN: 0.5000 mg | Freq: Once | INTRAMUSCULAR | Status: DC

## 2013-10-05 MED ORDER — DEXAMETHASONE SODIUM PHOSPHATE 10 MG/ML IJ SOLN
INTRAMUSCULAR | Status: DC | PRN
  Administered 2013-10-05: 10 mg via INTRAVENOUS

## 2013-10-05 MED ORDER — DOCUSATE SODIUM 100 MG PO CAPS
100.0000 mg | ORAL_CAPSULE | Freq: Two times a day (BID) | ORAL | Status: DC
  Administered 2013-10-05 – 2013-10-06 (×2): 100 mg via ORAL
  Filled 2013-10-05 (×3): qty 1

## 2013-10-05 MED ORDER — VANCOMYCIN HCL IN DEXTROSE 1-5 GM/200ML-% IV SOLN
1000.0000 mg | Freq: Once | INTRAVENOUS | Status: AC
  Administered 2013-10-05: 1000 mg via INTRAVENOUS
  Filled 2013-10-05: qty 200

## 2013-10-05 MED ORDER — ONDANSETRON HCL 4 MG/2ML IJ SOLN
INTRAMUSCULAR | Status: DC | PRN
  Administered 2013-10-05: 4 mg via INTRAVENOUS

## 2013-10-05 MED ORDER — BUPIVACAINE-EPINEPHRINE (PF) 0.25% -1:200000 IJ SOLN
INTRAMUSCULAR | Status: AC
  Filled 2013-10-05: qty 30

## 2013-10-05 MED ORDER — FENTANYL CITRATE 0.05 MG/ML IJ SOLN
25.0000 ug | INTRAMUSCULAR | Status: DC | PRN
  Administered 2013-10-05 (×2): 50 ug via INTRAVENOUS

## 2013-10-05 MED ORDER — DOXYCYCLINE HYCLATE 100 MG PO TABS
100.0000 mg | ORAL_TABLET | Freq: Two times a day (BID) | ORAL | Status: DC
  Administered 2013-10-05 – 2013-10-06 (×2): 100 mg via ORAL
  Filled 2013-10-05 (×3): qty 1

## 2013-10-05 MED ORDER — BUPIVACAINE HCL (PF) 0.25 % IJ SOLN
INTRAMUSCULAR | Status: AC
  Filled 2013-10-05: qty 30

## 2013-10-05 MED ORDER — SUCCINYLCHOLINE CHLORIDE 20 MG/ML IJ SOLN
INTRAMUSCULAR | Status: DC | PRN
  Administered 2013-10-05: 100 mg via INTRAVENOUS

## 2013-10-05 MED ORDER — MIDAZOLAM HCL 5 MG/5ML IJ SOLN
INTRAMUSCULAR | Status: DC | PRN
  Administered 2013-10-05: 2 mg via INTRAVENOUS

## 2013-10-05 SURGICAL SUPPLY — 38 items
APL SKNCLS STERI-STRIP NONHPOA (GAUZE/BANDAGES/DRESSINGS)
BENZOIN TINCTURE PRP APPL 2/3 (GAUZE/BANDAGES/DRESSINGS) IMPLANT
BLADE HEX COATED 2.75 (ELECTRODE) ×2 IMPLANT
BLADE SURG SZ10 CARB STEEL (BLADE) ×4 IMPLANT
CANISTER SUCTION 2500CC (MISCELLANEOUS) ×2 IMPLANT
DECANTER SPIKE VIAL GLASS SM (MISCELLANEOUS) IMPLANT
DRAPE LAPAROTOMY T 102X78X121 (DRAPES) IMPLANT
DRAPE LAPAROTOMY TRNSV 102X78 (DRAPE) IMPLANT
DRAPE LG THREE QUARTER DISP (DRAPES) IMPLANT
ELECT REM PT RETURN 9FT ADLT (ELECTROSURGICAL) ×2
ELECTRODE REM PT RTRN 9FT ADLT (ELECTROSURGICAL) ×1 IMPLANT
EVACUATOR SILICONE 100CC (DRAIN) IMPLANT
GAUZE SPONGE 4X4 12PLY STRL (GAUZE/BANDAGES/DRESSINGS) ×2 IMPLANT
GLOVE BIO SURGEON STRL SZ7.5 (GLOVE) ×4 IMPLANT
GLOVE BIOGEL PI IND STRL 7.0 (GLOVE) ×1 IMPLANT
GLOVE BIOGEL PI INDICATOR 7.0 (GLOVE) ×1
GOWN STRL REUS W/ TWL XL LVL3 (GOWN DISPOSABLE) ×1 IMPLANT
GOWN STRL REUS W/TWL LRG LVL3 (GOWN DISPOSABLE) ×2 IMPLANT
GOWN STRL REUS W/TWL XL LVL3 (GOWN DISPOSABLE) ×4 IMPLANT
KIT BASIN OR (CUSTOM PROCEDURE TRAY) ×2 IMPLANT
MARKER SKIN DUAL TIP RULER LAB (MISCELLANEOUS) IMPLANT
NDL HYPO 25X1 1.5 SAFETY (NEEDLE) ×1 IMPLANT
NEEDLE HYPO 25X1 1.5 SAFETY (NEEDLE) ×2 IMPLANT
NS IRRIG 1000ML POUR BTL (IV SOLUTION) ×2 IMPLANT
PACK BASIC VI WITH GOWN DISP (CUSTOM PROCEDURE TRAY) ×2 IMPLANT
PENCIL BUTTON HOLSTER BLD 10FT (ELECTRODE) ×2 IMPLANT
SOL PREP POV-IOD 4OZ 10% (MISCELLANEOUS) ×2 IMPLANT
SPONGE GAUZE 4X4 12PLY (GAUZE/BANDAGES/DRESSINGS) ×1 IMPLANT
SPONGE LAP 18X18 X RAY DECT (DISPOSABLE) IMPLANT
SPONGE LAP 4X18 X RAY DECT (DISPOSABLE) ×2 IMPLANT
STAPLER VISISTAT 35W (STAPLE) IMPLANT
SUT MNCRL AB 4-0 PS2 18 (SUTURE) IMPLANT
SUT VIC AB 3-0 SH 18 (SUTURE) IMPLANT
SYR CONTROL 10ML LL (SYRINGE) ×2 IMPLANT
TAPE CLOTH SURG 4X10 WHT LF (GAUZE/BANDAGES/DRESSINGS) ×1 IMPLANT
TOWEL OR 17X26 10 PK STRL BLUE (TOWEL DISPOSABLE) ×2 IMPLANT
WATER STERILE IRR 1500ML POUR (IV SOLUTION) ×2 IMPLANT
YANKAUER SUCT BULB TIP 10FT TU (MISCELLANEOUS) IMPLANT

## 2013-10-05 NOTE — ED Provider Notes (Signed)
CSN: [id number]     Arrival date & time 10/05/13  1031 History   First MD Initiated Contact with Patient 10/05/13 1048     Chief Complaint  Patient presents with  . Recurrent Skin Infections    left breast     (Consider location/radiation/quality/duration/timing/severity/associated sxs/prior Treatment) HPI Complains of swollen painful left breast onset 1.5 weeks ago. Seen here on 10/01/2013 subsequently underwent aspiration of abscess of left breast at the breast Center on 10/02/2013. Placed on clindamycin and Percocet. She presents today as pain and swelling of the left breast became worse over the past 3 days. She denies fever denies nausea or vomiting. No other complaint. Denies noncompliance with medications. Pain is worse with pressing on the area not improved by anything pain is severe and nonradiating Past Medical History  Diagnosis Date  . Abscess   . Rheumatic fever   . History of breast abscess 2013   Past Surgical History  Procedure Laterality Date  . Incise and drain abcess      lt breast   Family History  Problem Relation Age of Onset  . Heart disease Paternal Grandmother    History  Substance Use Topics  . Smoking status: Current Every Day Smoker -- 0.50 packs/day    Types: Cigarettes  . Smokeless tobacco: Not on file  . Alcohol Use: Yes   OB History   Grav Para Term Preterm Abortions TAB SAB Ect Mult Living                 Review of Systems  Skin: Positive for wound.       Breast abscess  All other systems reviewed and are negative.     Allergies  Hydrocodone  Home Medications   Prior to Admission medications   Medication Sig Start Date End Date Taking? Authorizing Provider  clindamycin (CLEOCIN) 150 MG capsule Take 2 capsules (300 mg total) by mouth 3 (three) times daily. May dispense as 150mg  capsules 09/30/13  Yes Kaitlyn Szekalski, PA-C  oxyCODONE-acetaminophen (PERCOCET/ROXICET) 5-325 MG per tablet Take 2 tablets by mouth every 4 (four) hours  as needed for moderate pain or severe pain. 10/02/13  Yes Kassie Mends, MD  sulfamethoxazole-trimethoprim (BACTRIM DS) 800-160 MG per tablet Take 1 tablet by mouth 2 (two) times daily. 10/02/13   Merla Riches, MD   BP 124/69  Pulse 95  Temp(Src) 98.7 F (37.1 C) (Oral)  Resp 18  SpO2 97%  LMP 09/28/2013 Physical Exam  Nursing note and vitals reviewed. Constitutional: She appears well-developed and well-nourished. She appears distressed.  Appears uncomfortable  HENT:  Head: Normocephalic and atraumatic.  Eyes: Conjunctivae are normal. Pupils are equal, round, and reactive to light.  Neck: Neck supple. No tracheal deviation present. No thyromegaly present.  Cardiovascular: Normal rate and regular rhythm.   No murmur heard. Pulmonary/Chest: Effort normal and breath sounds normal.  Left breast with golfball sized abscess at 12:00 bordering areola with surrounding redness and tenderness  Abdominal: Soft. Bowel sounds are normal. She exhibits no distension. There is no tenderness.  Musculoskeletal: Normal range of motion. She exhibits no edema and no tenderness.  Neurological: She is alert. Coordination normal.  Skin: Skin is warm and dry. No rash noted.  Psychiatric: She has a normal mood and affect.    ED Course  Procedures (including critical care time) Labs Review Labs Reviewed - No data to display  Imaging Review No results found.   EKG Interpretation None     Spoke with Dr.  Glennon Mac from [facility] radiology. He suggest surgical consult in light of recurrence and worsening of breast abscess. Results for orders placed during the hospital encounter of 10/05/13  CBC WITH DIFFERENTIAL      Result Value Ref Range   WBC 6.8  4.0 - 10.5 K/uL   RBC 4.35  3.87 - 5.11 MIL/uL   Hemoglobin 13.9  12.0 - 15.0 g/dL   HCT 40.6  36.0 - 46.0 %   MCV 93.3  78.0 - 100.0 fL   MCH 32.0  26.0 - 34.0 pg   MCHC 34.2  30.0 - 36.0 g/dL   RDW 14.1  11.5 - 15.5 %   Platelets 272  150 - 400 K/uL    Neutrophils Relative % 64  43 - 77 %   Neutro Abs 4.4  1.7 - 7.7 K/uL   Lymphocytes Relative 25  12 - 46 %   Lymphs Abs 1.7  0.7 - 4.0 K/uL   Monocytes Relative 9  3 - 12 %   Monocytes Absolute 0.6  0.1 - 1.0 K/uL   Eosinophils Relative 2  0 - 5 %   Eosinophils Absolute 0.1  0.0 - 0.7 K/uL   Basophils Relative 0  0 - 1 %   Basophils Absolute 0.0  0.0 - 0.1 K/uL  I-STAT CHEM 8, ED      Result Value Ref Range   Sodium 140  137 - 147 mEq/L   Potassium 4.2  3.7 - 5.3 mEq/L   Chloride 106  96 - 112 mEq/L   BUN 8  6 - 23 mg/dL   Creatinine, Ser 0.90  0.50 - 1.10 mg/dL   Glucose, Bld 104 (*) 70 - 99 mg/dL   Calcium, Ion 1.21  1.12 - 1.23 mmol/L   TCO2 20  0 - 100 mmol/L   Hemoglobin 15.3 (*) 12.0 - 15.0 g/dL   HCT 45.0  36.0 - 46.0 %   US Aspiration  10/02/2013   CLINICAL DATA:  Patient presents for attempted aspiration of a 4.1 cm complex fluid collection in the 12 o'clock position of the left periareolar region likely representing an abscess.  EXAM: ULTRASOUND GUIDED LEFT BREAST CYST ASPIRATION  COMPARISON:  Previous exams.  PROCEDURE: Using sterile technique, 6 mL lidocaine, ultrasound guidance, and an 18 gauge 3 cm needle, aspiration was performed of 4.1 cm complex fluid collection over the 12 o'clock position of the left periareolar region. Approximately 6 mL of yellowish purulent material was removed and sent for Gram stain and culture.  IMPRESSION: Ultrasound-guided aspiration of left breast abscess. No apparent complications. Patient to return for re-evaluation and possible re-aspiration in 2 weeks.   Electronically Signed   By: Marin Olp M.D.   On: 10/02/2013 16:52   Mm Digital Diagnostic Bilat  10/02/2013   CLINICAL DATA:  Patient presents for a bilateral diagnostic evaluation due to a recent history of pain tenderness,, warmth and erythema over the left periareolar region over the past week. Patient states purulent material draining from the left nipple. Recent ultrasound at the  emergency department [facility] demonstrates an ovoid 2.8 cm complex fluid collection at the 12 o'clock position of the left periareolar region. Patient was put on clindamycin which she has been on for 2-3 days without improvement. 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 Patient is an active smoker. Nondiabetic.  EXAM: DIGITAL DIAGNOSTIC  BILATERAL MAMMOGRAM WITH CAD  ULTRASOUND LEFT BREAST  COMPARISON:  Ultrasound 09/30/2013  ACR Breast Density Category b: There are scattered areas of fibroglandular density.  FINDINGS: Examination demonstrates increased density with associated skin thickening in the left retroareolar region. There are mild increased parenchymal markings over the central left breast. Minimal prominence of the left axillary lymph nodes compared to the right likely reactive. Remainder the exam is unremarkable.  Mammographic images were processed with CAD.  On physical exam, there is an area of erythema in the left periareolar region most prominent over the upper periareolar region measuring approximately 5-6 cm in diameter. There are 2 curvilinear scar is over the upper left periareolar region compatible with patient's previous surgical abscess drainage.  Ultrasound is performed, showing a well-defined oval complex fluid collection over the 12 o'clock position of the left periareolar region 1 cm from the nipple measuring approximately 1.8 x 2.6 x 4.1 cm as this is larger than seen on the recent ultrasound from 09/30/2013. This likely represents an abscess.  IMPRESSION: Increase in size of an oval complex fluid collection at the 12 o'clock position of the left periareolar region measuring 1.8 x 2.6 x 4.1 cm with adjacent skin thickening compatible with an abscess.  RECOMMENDATION: Recommend attempted aspiration of this complex fluid collection and continued antibiotic treatment. Recommend followup left breast  ultrasound in 2 weeks post aspiration and antibiotic treatment. Aspiration will be done today.  I have discussed the findings and recommendations with the patient. Results were also provided in writing at the conclusion of the visit. If applicable, a reminder letter will be sent to the patient regarding the next appointment.  BI-RADS CATEGORY  3: Probably benign.   Electronically Signed   By: Marin Olp M.D.   On: 10/02/2013 16:48   US Breast Ltd Uni Left Inc Axilla  10/02/2013   CLINICAL DATA:  Patient presents for a bilateral diagnostic evaluation due to a recent history of pain tenderness,, warmth and erythema over the left periareolar region over the past week. Patient states purulent material draining from the left nipple. Recent ultrasound at the emergency department [facility] demonstrates an ovoid 2.8 cm complex fluid collection at the 12 o'clock position of the left periareolar region. Patient was put on clindamycin which she has been on for 2-3 days without improvement. 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 Patient is an active smoker. Nondiabetic.  EXAM: DIGITAL DIAGNOSTIC  BILATERAL MAMMOGRAM WITH CAD  ULTRASOUND LEFT BREAST  COMPARISON:  Ultrasound 09/30/2013  ACR Breast Density Category b: There are scattered areas of fibroglandular density.  FINDINGS: Examination demonstrates increased density with associated skin thickening in the left retroareolar region. There are mild increased parenchymal markings over the central left breast. Minimal prominence of the left axillary lymph nodes compared to the right likely reactive. Remainder the exam is unremarkable.  Mammographic images were processed with CAD.  On physical exam, there is an area of erythema in the left periareolar region most prominent over the upper periareolar region measuring approximately 5-6 cm in diameter. There are 2 curvilinear scar is over  the upper left periareolar region compatible with patient's previous surgical abscess drainage.  Ultrasound is performed, showing a well-defined oval complex fluid collection over the 12 o'clock position of the left periareolar region 1 cm from the nipple measuring approximately 1.8 x 2.6 x 4.1 cm as this is larger than seen on the recent ultrasound from 09/30/2013. This likely represents an abscess.  IMPRESSION:  Increase in size of an oval complex fluid collection at the 12 o'clock position of the left periareolar region measuring 1.8 x 2.6 x 4.1 cm with adjacent skin thickening compatible with an abscess.  RECOMMENDATION: Recommend attempted aspiration of this complex fluid collection and continued antibiotic treatment. Recommend followup left breast ultrasound in 2 weeks post aspiration and antibiotic treatment. Aspiration will be done today.  I have discussed the findings and recommendations with the patient. Results were also provided in writing at the conclusion of the visit. If applicable, a reminder letter will be sent to the patient regarding the next appointment.  BI-RADS CATEGORY  3: Probably benign.   Electronically Signed   By: Marin Olp M.D.   On: 10/02/2013 16:48   US Breast Ltd Uni Left Inc Axilla  09/30/2013   CLINICAL DATA:  Left breast tenderness, warmth and pain. History of breast abscess.  EXAM: ULTRASOUND OF THE left BREAST  COMPARISON:  None  FINDINGS: Ultrasound is performed, showing at the left breast superior area lower area pain/redness is head oval hypoechoic lesion measuring 2.76 x 1.46 x 2.4 cm. Lesion has internal color flow. This lesion has and appearance of fibroadenoma and less likely to represent abscess. Note that this exam was scanned by ultrasound technologist at the [facility]  IMPRESSION: Probable benign findings. Oval hypoechoic lesion in the palpable area more likely represent fibroadenoma and less likely abscess.  RECOMMENDATION: Given the clinical symptoms of warmth, pain  and tenderness, if the clinical physician feels the patient has sign and symptoms of mastitis, the patient should be treated for mastitis. The oval hypoechoic lesion at the palpable area should be correlated with mammographic findings on outpatient basis.  I have discussed the findings and recommendations with the patient. Results were also provided in writing at the conclusion of the visit. If applicable, a reminder letter will be sent to the patient regarding the next appointment.  BI-RADS CATEGORY  3: Probably benign finding(s) - short interval follow-up suggested.   Electronically Signed   By: Abelardo Diesel M.D.   On: 09/30/2013 13:45    MDM  Spoke with [facility] who will evaluate and admit patient for surgical incision and drainage Final diagnoses:  None   diagnosis left breast abscess      Orlie Dakin, MD 10/05/13 [phone]

## 2013-10-05 NOTE — ED Notes (Signed)
Pt states that she was seen here last week and was told it was a cyst on her breast and went to breast center and was told it was an abscess and had it drained. Now left areola area swollen, red and sore. Pt denies any drainage.

## 2013-10-05 NOTE — Op Note (Signed)
10/05/2013  2:38 PM  PATIENT:  [name]  43 y.o. female  PRE-OPERATIVE DIAGNOSIS:  left breast abcess  POST-OPERATIVE DIAGNOSIS:  left breast abscess  PROCEDURE:  Procedure(s): IRRIGATION AND DEBRIDEMENT left breast ABSCESS (Left)  SURGEON:  Surgeon(s) and Role:    * Merrie Roof, MD - Primary  PHYSICIAN ASSISTANT:   ASSISTANTS: none   ANESTHESIA:   general  EBL:     BLOOD ADMINISTERED:none  DRAINS: none   LOCAL MEDICATIONS USED:  MARCAINE     SPECIMEN:  No Specimen  DISPOSITION OF SPECIMEN:  N/A  COUNTS:  YES  TOURNIQUET:  * No tourniquets in log *  DICTATION: .Dragon Dictation After informed consent was obtained patient was brought to the operating room and placed in the supine position on the operating room table. After adequate induction of general anesthesia the patient's left breast was prepped with ChloraPrep, allowed to dry, and draped in usual sterile manner. The patient had a fluctuant area in the 12:00 position. This area was opened sharply with a 15 blade knife. Cultures were taken. A large amount of purulent material was evacuated. On closer inspection there was sebaceous cyst material in the base of the wound. The lining of the cyst was excised sharply with Metzenbaum scissors. Hemostasis was achieved using the Bovie electrocautery. The wound is infiltrated with quarter percent Marcaine. The wound was then packed with a 4 x 4 gauze. Sterile dressings were applied. The patient tolerated the procedure well. At the end of the case all needle sponge and instrument counts were correct. The patient was then awakened and taken to recovery in stable condition.  PLAN OF CARE: Admit for overnight observation  PATIENT DISPOSITION:  PACU - hemodynamically stable.   Delay start of Pharmacological VTE agent (>24hrs) due to surgical blood loss or risk of bleeding: no

## 2013-10-05 NOTE — Anesthesia Postprocedure Evaluation (Signed)
Anesthesia Post Note  Patient: [name]  Procedure(s) Performed: Procedure(s) (LRB): IRRIGATION AND DEBRIDEMENT left breast ABSCESS (Left)  Anesthesia type: General  Patient location: PACU  Post pain: Pain level controlled  Post assessment: Post-op Vital signs reviewed  Last Vitals:  Filed Vitals:   10/05/13 1515  BP: 123/78  Pulse: 71  Temp: 36.8 C  Resp: 12    Post vital signs: Reviewed  Level of consciousness: sedated  Complications: No apparent anesthesia complications

## 2013-10-05 NOTE — Anesthesia Preprocedure Evaluation (Addendum)
Anesthesia Evaluation  Patient identified by MRN, date of birth, ID band Patient awake    Reviewed: Allergy & Precautions, H&P , NPO status , Patient's Chart, lab work & pertinent test results  Airway Mallampati: II TM Distance: >3 FB Neck ROM: Full    Dental  (+) Teeth Intact, Poor Dentition, Chipped, Dental Advisory Given,    Pulmonary neg pulmonary ROS, Current Smoker,  breath sounds clear to auscultation  Pulmonary exam normal       Cardiovascular negative cardio ROS  Rhythm:Regular Rate:Normal     Neuro/Psych negative neurological ROS  negative psych ROS   GI/Hepatic negative GI ROS, (+)     substance abuse  alcohol use,   Endo/Other  negative endocrine ROS  Renal/GU negative Renal ROS  negative genitourinary   Musculoskeletal negative musculoskeletal ROS (+)   Abdominal   Peds  Hematology negative hematology ROS (+)   Anesthesia Other Findings   Reproductive/Obstetrics                         Anesthesia Physical Anesthesia Plan  ASA: II and emergent  Anesthesia Plan: General   Post-op Pain Management:    Induction: Intravenous  Airway Management Planned: LMA and Oral ETT  Additional Equipment:   Intra-op Plan:   Post-operative Plan: Extubation in OR  Informed Consent: I have reviewed the patients History and Physical, chart, labs and discussed the procedure including the risks, benefits and alternatives for the proposed anesthesia with the patient or authorized representative who has indicated his/her understanding and acceptance.   Dental advisory given  Plan Discussed with: CRNA  Anesthesia Plan Comments:         Anesthesia Quick Evaluation

## 2013-10-05 NOTE — H&P (Signed)
[facility] Admission Note  [name] 10/19/1970  [id number].    Requesting MD: Dr. Winfred Leeds Chief Complaint/Reason for Consult: Left breast abscess  HPI:  43 y/o AA female, c/o left breast pain/swelling which started 1.5 weeks ago.  No radicular pain.  Denies streaking or pain/inflammation in the axilla.  No fever/chills, but "hot flashes".  No CP/SOB, abdominal pain, N/V/D.  She's been to the ER twice.  She was diagnosed with mastitis and started on Clindamycin on 09/30/13 on her first visit to the ER.  She returned to the ER on 10/01/13.  She was again sent home with antibiotics and referred to the women's center for further evaluation.  The women's center(10/02/13) referred her to breast center and she was started on Bactrim.  She had a left breast aspiration on Friday 10/02/13 and was able to get some purulent drainage out.   No cultures were obtained.  Over the weekend it re-developed and is overall no better than before the aspiration.  Smokes 1 pack every 2 days.  Drinks 1/2 bottle wine on social occasions.    ROS: All systems reviewed and otherwise negative except for as above  Family History  Problem Relation Age of Onset  . Heart disease Paternal Grandmother     Past Medical History  Diagnosis Date  . Abscess   . Rheumatic fever   . History of breast abscess 2013    Past Surgical History  Procedure Laterality Date  . Incise and drain abcess      lt breast    Social History:  reports that she has been smoking Cigarettes.  She has been smoking about 0.50 packs per day. She does not have any smokeless tobacco history on file. She reports that she drinks alcohol. She reports that she does not use illicit drugs.  Allergies:  Allergies  Allergen Reactions  . Hydrocodone Itching    Pt tolerates oxycodone     (Not in a hospital admission)  Blood pressure 124/69, pulse 95, temperature 98.7 F (37.1 C), temperature source Oral, resp. rate 18, last  menstrual period 09/28/2013, SpO2 97.00%. Physical Exam: General: pleasant, WD/WN AA female who is laying in bed in NAD HEENT: head is normocephalic, atraumatic.  Sclera are noninjected.  PERRL.  Ears and nose without any masses or lesions.  Mouth is pink and moist Heart: regular, rate, and rhythm.  No obvious murmurs, gallops, or rubs noted.  Palpable pedal pulses bilaterally Lungs: CTAB, no wheezes, rhonchi, or rales noted.  Respiratory effort nonlabored Abd: soft, NT/ND, +BS, no masses, hernias, or organomegaly MS: all 4 extremities are symmetrical with no cyanosis, clubbing, or edema. Skin: warm and dry.  Left breast:  3cm area of erythema and induration above the areola which tracts around the entire areola with central fluctuance noted 1-1.5cm, significantly tender.  Sebaceous cyst to left axilla (1.5cm wide), right axilla 31mm and flat, 1.5cm round cyst left ear.  No LAD to b/l axilla. Psych: A&Ox3 with an appropriate affect.   No results found for this or any previous visit (from the past 48 hour(s)). No results found.    Assessment/Plan Left recurrent breast abscess s/p IR drainage on 10/02/13 at breast center Leukocytosis - 15.3 H/o I&D 2013 for left breast abscess H/o rheumatic fever Sebaceous cysts - axilla and periauricular  Plan: 1.  Admit to floor, but take urgently to OR for incision/drainage 2.  NPO, IVF, pain control, antiemetics, antibiotics (vanc 1000mg  once, doxy 100mg  bid) 3.  SCD's  4.  Ambulate and IS 5.  Hopefully home tomorrow am with [facility] to help with packing changes   Coralie Keens, [facility] 10/05/2013, 12:19 PM Pager: [phone]

## 2013-10-05 NOTE — Transfer of Care (Signed)
Immediate Anesthesia Transfer of Care Note  Patient: [name]  Procedure(s) Performed: Procedure(s) (LRB): IRRIGATION AND DEBRIDEMENT left breast ABSCESS (Left)  Patient Location: PACU  Anesthesia Type: General  Level of Consciousness: sedated, patient cooperative and responds to stimulation  Airway & Oxygen Therapy: Patient Spontanous Breathing and Patient connected to face mask oxgen  Post-op Assessment: Report given to PACU RN and Post -op Vital signs reviewed and stable  Post vital signs: Reviewed and stable  Complications: No apparent anesthesia complications

## 2013-10-05 NOTE — H&P (Signed)
Seen and agree. Risks and benefits of surgery discussed with pt and she understands and wishes to proceed

## 2013-10-06 ENCOUNTER — Encounter (HOSPITAL_COMMUNITY): Payer: Self-pay | Admitting: General Surgery

## 2013-10-06 LAB — BASIC METABOLIC PANEL
Anion gap: 11 (ref 5–15)
BUN: 8 mg/dL (ref 6–23)
CALCIUM: 8.8 mg/dL (ref 8.4–10.5)
CHLORIDE: 108 meq/L (ref 96–112)
CO2: 21 meq/L (ref 19–32)
CREATININE: 0.83 mg/dL (ref 0.50–1.10)
GFR calc Af Amer: >90 mL/min (ref >90)
GFR calc non Af Amer: 85 mL/min — ABNORMAL LOW (ref >90)
GLUCOSE: 98 mg/dL (ref 70–99)
Potassium: 4.3 mEq/L (ref 3.7–5.3)
Sodium: 140 mEq/L (ref 137–147)

## 2013-10-06 LAB — CBC
HCT: 39.9 % (ref 36.0–46.0)
HEMOGLOBIN: 13 g/dL (ref 12.0–15.0)
MCH: 31.5 pg (ref 26.0–34.0)
MCHC: 32.6 g/dL (ref 30.0–36.0)
MCV: 96.6 fL (ref 78.0–100.0)
Platelets: 210 10*3/uL (ref 150–400)
RBC: 4.13 MIL/uL (ref 3.87–5.11)
RDW: 14.4 % (ref 11.5–15.5)
WBC: 5.8 10*3/uL (ref 4.0–10.5)

## 2013-10-06 MED ORDER — MORPHINE SULFATE 2 MG/ML IJ SOLN: 2.0000 mg | INTRAMUSCULAR | Status: DC | PRN

## 2013-10-06 MED ORDER — SODIUM CHLORIDE 0.9 % IJ SOLN: 3.0000 mL | Freq: Two times a day (BID) | INTRAMUSCULAR | Status: DC

## 2013-10-06 MED ORDER — OXYCODONE HCL 10 MG PO TABS: 10.0000 mg | ORAL_TABLET | Freq: Three times a day (TID) | ORAL | Status: AC | PRN

## 2013-10-06 MED ORDER — SODIUM CHLORIDE 0.9 % IJ SOLN: 3.0000 mL | INTRAMUSCULAR | Status: DC | PRN

## 2013-10-06 MED ORDER — DOXYCYCLINE HYCLATE 100 MG PO TABS: 100.0000 mg | ORAL_TABLET | Freq: Two times a day (BID) | ORAL | Status: DC

## 2013-10-06 NOTE — Discharge Instructions (Signed)

## 2013-10-06 NOTE — Progress Notes (Signed)
Nutrition Brief Note  Patient identified on the Malnutrition Screening Tool (MST) Report  Wt Readings from Last 15 Encounters:  10/05/13 177 lb 7.5 oz (80.5 kg)  10/05/13 177 lb 7.5 oz (80.5 kg)  10/02/13 177 lb 8 oz (80.513 kg)  10/01/13 162 lb (73.483 kg)  01/11/12 182 lb 12.8 oz (82.918 kg)  04/27/11 175 lb 3.2 oz (79.47 kg)    Body mass index is 28.66 kg/(m^2). Patient meets criteria for overweight based on current BMI.   Current diet order is regular, patient is consuming approximately 100% of meals at this time. Labs and medications reviewed. Admitted with left breast abscess, had irrigation and debridement yesterday. Pt states she ate poorly for 3 days due to having pain but before then had great appetite, 3 meals/day. Weight stable. Eager to go home.   No nutrition interventions warranted at this time. If nutrition issues arise, please consult RD.   [name] MS, [place], LDN [phone] Pager [phone] Weekend/After Hours Pager

## 2013-10-06 NOTE — Care Management Note (Addendum)
  Page 1 of 1   10/06/2013     9:44:29 AM CARE MANAGEMENT NOTE 10/06/2013  Patient:  [name], [name]   Account Number:  [id number]  Date Initiated:  10/06/2013  Documentation initiated by:  [facility]  Subjective/Objective Assessment:   adm: L breast abscess     Action/Plan:   discharge planning   Anticipated DC Date:  10/06/2013   Anticipated DC Plan:  [place]  CM consult      Choice offered to / List presented to:          [facility] arranged  HH-1 RN      [place].   Status of service:  Completed, signed off Medicare Important Message given?   (If response is "NO", the following Medicare IM given date fields will be blank) Date Medicare IM given:   Medicare IM given by:   Date Additional Medicare IM given:   Additional Medicare IM given by:    Discharge Disposition:  [place]  Per UR Regulation:    If discussed at Long Length of Stay Meetings, dates discussed:    Comments:  10/06/13 09:20 Cm met with pt in room to offer choice for home health services.  Pt chooses [facility] to render [facility] services for dressing changes.  Pt stagtes her mother helps her ot and she will receive HHRN at [address] [place], [place] [address] and her best contact number is [phone].  Referral made to [facility] rep, Kristen with address and contact changes.  SOC will  be tonight.  No other CM needs were communicated.  Mariane Masters, BSN, CM [phone].

## 2013-10-06 NOTE — Progress Notes (Signed)
10/06/13  1205  Reviewed discharge instructions with patient. Patient verbalized understanding of discharge instructions.  Copy of discharge instructions and prescriptions given to patient.

## 2013-10-06 NOTE — Progress Notes (Signed)
Patient ID: [name], female   DOB: Apr 06, 1970, 43 y.o.   MRN: [id number]  Subjective: Hungry.  Wants to go home.    Objective:  Vital signs:  Filed Vitals:   10/05/13 1817 10/05/13 2006 10/06/13 0039 10/06/13 0512  BP: 112/80 104/71 106/71 105/70  Pulse: 72 67 68 67  Temp: 98 F (36.7 C) 98.9 F (37.2 C) 98.4 F (36.9 C) 98.5 F (36.9 C)  TempSrc: Oral Oral Oral Oral  Resp: '18 18 16 18  ' Height:  '5\' 6"'  (1.676 m)    Weight:  177 lb 7.5 oz (80.5 kg)    SpO2: 99% 98% 95% 98%    Last BM Date: 10/04/13  Intake/Output   Yesterday:  07/20 0701 - 07/21 0700 In: 3546.7 [P.O.:1320; I.V.:2226.7] Out: 2050 [Urine:2050] This shift:  Total I/O In: -  Out: 300 [Urine:300]   Physical Exam: General: Pt awake/alert/oriented x4 in no acute distress Skin: small wound to left breast, no purulent drainage, no surrounding erythema.     Problem List:   Active Problems:   Left breast abscess    Results:   Labs: Results for orders placed during the hospital encounter of 10/05/13 (from the past 48 hour(s))  CBC WITH DIFFERENTIAL     Status: None   Collection Time    10/05/13 12:23 PM      Result Value Ref Range   WBC 6.8  4.0 - 10.5 K/uL   RBC 4.35  3.87 - 5.11 MIL/uL   Hemoglobin 13.9  12.0 - 15.0 g/dL   HCT 40.6  36.0 - 46.0 %   MCV 93.3  78.0 - 100.0 fL   MCH 32.0  26.0 - 34.0 pg   MCHC 34.2  30.0 - 36.0 g/dL   RDW 14.1  11.5 - 15.5 %   Platelets 272  150 - 400 K/uL   Neutrophils Relative % 64  43 - 77 %   Neutro Abs 4.4  1.7 - 7.7 K/uL   Lymphocytes Relative 25  12 - 46 %   Lymphs Abs 1.7  0.7 - 4.0 K/uL   Monocytes Relative 9  3 - 12 %   Monocytes Absolute 0.6  0.1 - 1.0 K/uL   Eosinophils Relative 2  0 - 5 %   Eosinophils Absolute 0.1  0.0 - 0.7 K/uL   Basophils Relative 0  0 - 1 %   Basophils Absolute 0.0  0.0 - 0.1 K/uL  I-STAT CHEM 8, ED     Status: Abnormal   Collection Time    10/05/13 12:33 PM      Result Value Ref Range   Sodium 140  137 - 147  mEq/L   Potassium 4.2  3.7 - 5.3 mEq/L   Chloride 106  96 - 112 mEq/L   BUN 8  6 - 23 mg/dL   Creatinine, Ser 0.90  0.50 - 1.10 mg/dL   Glucose, Bld 104 (*) 70 - 99 mg/dL   Calcium, Ion 1.21  1.12 - 1.23 mmol/L   TCO2 20  0 - 100 mmol/L   Hemoglobin 15.3 (*) 12.0 - 15.0 g/dL   HCT 45.0  36.0 - 46.0 %  GLUCOSE, CAPILLARY     Status: Abnormal   Collection Time    10/05/13  9:25 PM      Result Value Ref Range   Glucose-Capillary 143 (*) 70 - 99 mg/dL  BASIC METABOLIC PANEL     Status: Abnormal   Collection Time    10/06/13  5:05 AM      Result Value Ref Range   Sodium 140  137 - 147 mEq/L   Potassium 4.3  3.7 - 5.3 mEq/L   Chloride 108  96 - 112 mEq/L   CO2 21  19 - 32 mEq/L   Glucose, Bld 98  70 - 99 mg/dL   BUN 8  6 - 23 mg/dL   Creatinine, Ser 0.83  0.50 - 1.10 mg/dL   Calcium 8.8  8.4 - 10.5 mg/dL   GFR calc non Af Amer 85 (*) >90 mL/min   GFR calc Af Amer >90  >90 mL/min   Comment: (NOTE)     The eGFR has been calculated using the CKD EPI equation.     This calculation has not been validated in all clinical situations.     eGFR's persistently <90 mL/min signify possible Chronic Kidney     Disease.   Anion gap 11  5 - 15  CBC     Status: None   Collection Time    10/06/13  5:05 AM      Result Value Ref Range   WBC 5.8  4.0 - 10.5 K/uL   RBC 4.13  3.87 - 5.11 MIL/uL   Hemoglobin 13.0  12.0 - 15.0 g/dL   HCT 39.9  36.0 - 46.0 %   MCV 96.6  78.0 - 100.0 fL   MCH 31.5  26.0 - 34.0 pg   MCHC 32.6  30.0 - 36.0 g/dL   RDW 14.4  11.5 - 15.5 %   Platelets 210  150 - 400 K/uL    Imaging / Studies: No results found.  Scheduled Meds: . docusate sodium  100 mg Oral BID  . doxycycline  100 mg Oral Q12H   Continuous Infusions: . 0.9 % NaCl with KCl 20 mEq / L 100 mL/hr at 10/06/13 0252   PRN Meds:.diphenhydrAMINE, diphenhydrAMINE, morphine injection, oxyCODONE, oxyCODONE-acetaminophen   Antibiotics: Anti-infectives   Start     Dose/Rate Route Frequency Ordered Stop    10/05/13 1300  [MAR Hold]  vancomycin (VANCOCIN) IVPB 1000 mg/200 mL premix     (On MAR Hold since 10/05/13 1320)   1,000 mg 200 mL/hr over 60 Minutes Intravenous  Once 10/05/13 1251 10/05/13 1330   10/05/13 1300  doxycycline (VIBRA-TABS) tablet 100 mg     100 mg Oral Every 12 hours 10/05/13 1251        Assessment/Plan POD#1 I&D of left breast -dressing changes, wound is clean without surrounding erythema.  If HH can come out this afternoon/evening, pt okay to go home and then sister will help with dressing changes.  If unable to arrange, will keep her overnight and discharge in AM -follow cultures -continue with doxycycline   Erby Pian, [facility] Surgery Pager [phone] Office [phone]  10/06/2013 8:14 AM

## 2013-10-07 NOTE — Discharge Summary (Signed)
Physician Discharge Summary  [name] CBJ:[id number] DOB: Feb 15, 1971 DOA: 10/05/2013  PCP: PROVIDER NOT IN SYSTEM  Consultation: none  Admit date: 10/05/2013 Discharge date: 10/07/2013  Recommendations for Outpatient Follow-up:   Follow-up Information   Follow up with [facility] On 10/27/2013. (arrive by 2:45PM for a 3:15PM appt  , For wound re-check)    Contact information:   [place] [address] [phone]       Follow up with [facility]. (home health nurse for dressing changes)    Contact information:   [address] [place] [address] [phone]      Discharge Diagnoses:  1. Left breast abscess   Surgical Procedure: incision and drainage of left breast abscess---Dr. Marlou Starks 09/2013  Discharge Condition: stable Disposition: home  Diet recommendation: regular  Filed Weights   10/05/13 2006  Weight: 177 lb 7.5 oz (80.5 kg)     Filed Vitals:   10/06/13 0920  BP: 110/68  Pulse: 72  Temp: 98.3 F (36.8 C)  Resp: 18     Hospital Course:  [name] is a healthy female who presented to [facility] with left breast pain and swelling for the third time.   She had been to the ER twice before. She was diagnosed with mastitis and started on Clindamycin on 09/30/13 on her first visit to the ER. She returned to the ER on 10/01/13. She was again sent home with antibiotics and referred to the women's center for further evaluation. The women's center(10/02/13) referred her to breast center and she was started on Bactrim. She had a left breast aspiration on Friday 10/02/13 and was able to get some purulent drainage out. No cultures were obtained. The patient was admitted and underwent an I&D.  She tolerated the procedure well and was transferred to the floor where she remained stable.  On POD#1 her wound was clean, afebrile, pain controlled.  She was therefore felt stable for discharge home with [facility] for  dressing changes BID which I demonstrated for the patient.    She was given an Rx for pain meds to take prior to dressing changes, would not recommend prescribing further narcotics before her follow up.  Her follow up has been arranged.  We discussed warning signs that warrant immediate attention.  She was given Rx for doxycycline to complete her course.  Medication risks, benefits and therapeutic alternatives were reviewed with the patient.  She verbalizes understanding.  She was encouraged to call with questions or concerns.     Discharge Instructions     Medication List    STOP taking these medications       clindamycin 150 MG capsule  Commonly known as:  CLEOCIN     oxyCODONE-acetaminophen 5-325 MG per tablet  Commonly known as:  PERCOCET/ROXICET     sulfamethoxazole-trimethoprim 800-160 MG per tablet  Commonly known as:  BACTRIM DS      TAKE these medications       doxycycline 100 MG tablet  Commonly known as:  VIBRA-TABS  Take 1 tablet (100 mg total) by mouth every 12 (twelve) hours.     Oxycodone HCl 10 MG Tabs  Take 1 tablet (10 mg total) by mouth 3 (three) times daily as needed for moderate pain, severe pain or breakthrough pain.           Follow-up Information   Follow up with [facility] On 10/27/2013. (arrive  by 2:45PM for a 3:15PM appt  , For wound re-check)    Contact information:   [place] [address] [phone]       Follow up with [facility]. (home health nurse for dressing changes)    Contact information:   [address] [place] [address] [phone]        The results of significant diagnostics from this hospitalization (including imaging, microbiology, ancillary and laboratory) are listed below for reference.    Significant Diagnostic Studies: US Aspiration  10/02/2013   CLINICAL DATA:  Patient presents for attempted aspiration of a 4.1 cm complex fluid collection  in the 12 o'clock position of the left periareolar region likely representing an abscess.  EXAM: ULTRASOUND GUIDED LEFT BREAST CYST ASPIRATION  COMPARISON:  Previous exams.  PROCEDURE: Using sterile technique, 6 mL lidocaine, ultrasound guidance, and an 18 gauge 3 cm needle, aspiration was performed of 4.1 cm complex fluid collection over the 12 o'clock position of the left periareolar region. Approximately 6 mL of yellowish purulent material was removed and sent for Gram stain and culture.  IMPRESSION: Ultrasound-guided aspiration of left breast abscess. No apparent complications. Patient to return for re-evaluation and possible re-aspiration in 2 weeks.   Electronically Signed   By: Marin Olp M.D.   On: 10/02/2013 16:52   Mm Digital Diagnostic Bilat  10/02/2013   CLINICAL DATA:  Patient presents for a bilateral diagnostic evaluation due to a recent history of pain tenderness,, warmth and erythema over the left periareolar region over the past week. Patient states purulent material draining from the left nipple. Recent ultrasound at the emergency department [facility] demonstrates an ovoid 2.8 cm complex fluid collection at the 12 o'clock position of the left periareolar region. Patient was put on clindamycin which she has been on for 2-3 days without improvement. 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 Patient is an active smoker. Nondiabetic.  EXAM: DIGITAL DIAGNOSTIC  BILATERAL MAMMOGRAM WITH CAD  ULTRASOUND LEFT BREAST  COMPARISON:  Ultrasound 09/30/2013  ACR Breast Density Category b: There are scattered areas of fibroglandular density.  FINDINGS: Examination demonstrates increased density with associated skin thickening in the left retroareolar region. There are mild increased parenchymal markings over the central left breast. Minimal prominence of the left axillary lymph nodes compared to the right likely reactive.  Remainder the exam is unremarkable.  Mammographic images were processed with CAD.  On physical exam, there is an area of erythema in the left periareolar region most prominent over the upper periareolar region measuring approximately 5-6 cm in diameter. There are 2 curvilinear scar is over the upper left periareolar region compatible with patient's previous surgical abscess drainage.  Ultrasound is performed, showing a well-defined oval complex fluid collection over the 12 o'clock position of the left periareolar region 1 cm from the nipple measuring approximately 1.8 x 2.6 x 4.1 cm as this is larger than seen on the recent ultrasound from 09/30/2013. This likely represents an abscess.  IMPRESSION: Increase in size of an oval complex fluid collection at the 12 o'clock position of the left periareolar region measuring 1.8 x 2.6 x 4.1 cm with adjacent skin thickening compatible with an abscess.  RECOMMENDATION: Recommend attempted aspiration of this complex fluid collection and continued antibiotic treatment. Recommend followup left breast ultrasound in 2 weeks post aspiration and antibiotic treatment. Aspiration will be done  today.  I have discussed the findings and recommendations with the patient. Results were also provided in writing at the conclusion of the visit. If applicable, a reminder letter will be sent to the patient regarding the next appointment.  BI-RADS CATEGORY  3: Probably benign.   Electronically Signed   By: Marin Olp M.D.   On: 10/02/2013 16:48   US Breast Ltd Uni Left Inc Axilla  10/02/2013   CLINICAL DATA:  Patient presents for a bilateral diagnostic evaluation due to a recent history of pain tenderness,, warmth and erythema over the left periareolar region over the past week. Patient states purulent material draining from the left nipple. Recent ultrasound at the emergency department [facility] demonstrates an ovoid 2.8 cm complex fluid collection at the 12 o'clock position of  the left periareolar region. Patient was put on clindamycin which she has been on for 2-3 days without improvement. 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 Patient is an active smoker. Nondiabetic.  EXAM: DIGITAL DIAGNOSTIC  BILATERAL MAMMOGRAM WITH CAD  ULTRASOUND LEFT BREAST  COMPARISON:  Ultrasound 09/30/2013  ACR Breast Density Category b: There are scattered areas of fibroglandular density.  FINDINGS: Examination demonstrates increased density with associated skin thickening in the left retroareolar region. There are mild increased parenchymal markings over the central left breast. Minimal prominence of the left axillary lymph nodes compared to the right likely reactive. Remainder the exam is unremarkable.  Mammographic images were processed with CAD.  On physical exam, there is an area of erythema in the left periareolar region most prominent over the upper periareolar region measuring approximately 5-6 cm in diameter. There are 2 curvilinear scar is over the upper left periareolar region compatible with patient's previous surgical abscess drainage.  Ultrasound is performed, showing a well-defined oval complex fluid collection over the 12 o'clock position of the left periareolar region 1 cm from the nipple measuring approximately 1.8 x 2.6 x 4.1 cm as this is larger than seen on the recent ultrasound from 09/30/2013. This likely represents an abscess.  IMPRESSION: Increase in size of an oval complex fluid collection at the 12 o'clock position of the left periareolar region measuring 1.8 x 2.6 x 4.1 cm with adjacent skin thickening compatible with an abscess.  RECOMMENDATION: Recommend attempted aspiration of this complex fluid collection and continued antibiotic treatment. Recommend followup left breast ultrasound in 2 weeks post aspiration and antibiotic treatment. Aspiration will be done today.  I have discussed the findings and  recommendations with the patient. Results were also provided in writing at the conclusion of the visit. If applicable, a reminder letter will be sent to the patient regarding the next appointment.  BI-RADS CATEGORY  3: Probably benign.   Electronically Signed   By: Marin Olp M.D.   On: 10/02/2013 16:48   US Breast Ltd Uni Left Inc Axilla  09/30/2013   CLINICAL DATA:  Left breast tenderness, warmth and pain. History of breast abscess.  EXAM: ULTRASOUND OF THE left BREAST  COMPARISON:  None  FINDINGS: Ultrasound is performed, showing at the left breast superior area lower area pain/redness is head oval hypoechoic lesion measuring 2.76 x 1.46 x 2.4 cm. Lesion has internal color flow. This lesion has and appearance of fibroadenoma and less likely to represent abscess. Note that this exam was scanned by ultrasound technologist at the [facility]  IMPRESSION: Probable benign findings. Oval hypoechoic lesion in the palpable area more  likely represent fibroadenoma and less likely abscess.  RECOMMENDATION: Given the clinical symptoms of warmth, pain and tenderness, if the clinical physician feels the patient has sign and symptoms of mastitis, the patient should be treated for mastitis. The oval hypoechoic lesion at the palpable area should be correlated with mammographic findings on outpatient basis.  I have discussed the findings and recommendations with the patient. Results were also provided in writing at the conclusion of the visit. If applicable, a reminder letter will be sent to the patient regarding the next appointment.  BI-RADS CATEGORY  3: Probably benign finding(s) - short interval follow-up suggested.   Electronically Signed   By: Abelardo Diesel M.D.   On: 09/30/2013 13:45    Microbiology: Recent Results (from the past 240 hour(s))  CULTURE, ROUTINE-ABSCESS     Status: None   Collection Time    10/05/13  2:13 PM      Result Value Ref Range Status   Specimen Description ABSCESS LEFT BREAST   Final    Special Requests NONE   Final   Gram Stain     Final   Value: RARE WBC PRESENT, PREDOMINANTLY MONONUCLEAR     NO SQUAMOUS EPITHELIAL CELLS SEEN     ABUNDANT GRAM POSITIVE COCCI IN PAIRS     IN CLUSTERS ABUNDANT GRAM NEGATIVE RODS     Performed at [facility]   Culture     Final   Value: Culture reincubated for better growth     Performed at [facility]   Report Status PENDING   Incomplete  ANAEROBIC CULTURE     Status: None   Collection Time    10/05/13  2:13 PM      Result Value Ref Range Status   Specimen Description ABSCESS LEFT BREAST   Final   Special Requests NONE   Final   Gram Stain     Final   Value: RARE WBC PRESENT, PREDOMINANTLY MONONUCLEAR     NO SQUAMOUS EPITHELIAL CELLS SEEN     ABUNDANT GRAM POSITIVE COCCI IN PAIRS     IN CLUSTERS ABUNDANT GRAM NEGATIVE RODS     Performed at [facility]   Culture     Final   Value: NO ANAEROBES ISOLATED; CULTURE IN PROGRESS FOR 5 DAYS     Performed at [facility]   Report Status PENDING   Incomplete     Labs: Basic Metabolic Panel:  Recent Labs Lab 10/05/13 1233 10/06/13 0505  NA 140 140  K 4.2 4.3  CL 106 108  CO2  --  21  GLUCOSE 104* 98  BUN 8 8  CREATININE 0.90 0.83  CALCIUM  --  8.8   Liver Function Tests: No results found for this basename: AST, ALT, ALKPHOS, BILITOT, PROT, ALBUMIN,  in the last 168 hours No results found for this basename: LIPASE, AMYLASE,  in the last 168 hours No results found for this basename: AMMONIA,  in the last 168 hours CBC:  Recent Labs Lab 10/05/13 1223 10/05/13 1233 10/06/13 0505  WBC 6.8  --  5.8  NEUTROABS 4.4  --   --   HGB 13.9 15.3* 13.0  HCT 40.6 45.0 39.9  MCV 93.3  --  96.6  PLT 272  --  210   Cardiac Enzymes: No results found for this basename: CKTOTAL, CKMB, CKMBINDEX, TROPONINI,  in the last 168 hours BNP: BNP (last 3 results) No results found for this basename: PROBNP,  in the last 8760  hours CBG:  Recent Labs Lab  10/05/13 2125  GLUCAP 143*    Active Problems:   Left breast abscess   Time coordinating discharge: <30 mins  Signed:  Joellen Tullos, ANP-BC

## 2013-10-09 ENCOUNTER — Other Ambulatory Visit (INDEPENDENT_AMBULATORY_CARE_PROVIDER_SITE_OTHER): Payer: Self-pay | Admitting: *Deleted

## 2013-10-09 DIAGNOSIS — N611 Abscess of the breast and nipple: Secondary | ICD-10-CM

## 2013-10-09 LAB — CULTURE, ROUTINE-ABSCESS

## 2013-10-09 MED ORDER — AMOXICILLIN-POT CLAVULANATE 875-125 MG PO TABS: 1.0000 | ORAL_TABLET | Freq: Two times a day (BID) | ORAL | Status: AC

## 2013-10-10 LAB — ANAEROBIC CULTURE

## 2013-10-13 ENCOUNTER — Encounter (INDEPENDENT_AMBULATORY_CARE_PROVIDER_SITE_OTHER): Payer: No Typology Code available for payment source | Admitting: Surgery

## 2013-10-16 ENCOUNTER — Ambulatory Visit
Admission: RE | Admit: 2013-10-16 | Discharge: 2013-10-16 | Disposition: A | Discharge status: Home / Self Care | Payer: No Typology Code available for payment source | Source: Ambulatory Visit | Attending: Family Medicine | Admitting: Family Medicine

## 2013-10-16 DIAGNOSIS — N6459 Other signs and symptoms in breast: Secondary | ICD-10-CM

## 2013-10-27 ENCOUNTER — Encounter (INDEPENDENT_AMBULATORY_CARE_PROVIDER_SITE_OTHER): Payer: No Typology Code available for payment source

## 2014-09-06 ENCOUNTER — Inpatient Hospital Stay (HOSPITAL_COMMUNITY): Payer: Self-pay | Admitting: Registered Nurse

## 2014-09-06 ENCOUNTER — Inpatient Hospital Stay (HOSPITAL_COMMUNITY): Payer: No Typology Code available for payment source | Admitting: Registered Nurse

## 2014-09-06 ENCOUNTER — Ambulatory Visit (HOSPITAL_COMMUNITY)
Admission: EM | Admit: 2014-09-06 | Discharge: 2014-09-07 | Disposition: A | Discharge status: Home / Self Care | Payer: Self-pay | Attending: General Surgery | Admitting: General Surgery

## 2014-09-06 ENCOUNTER — Encounter (HOSPITAL_COMMUNITY): Payer: Self-pay | Admitting: Emergency Medicine

## 2014-09-06 ENCOUNTER — Encounter (HOSPITAL_COMMUNITY): Admission: EM | Disposition: A | Discharge status: Home / Self Care | Payer: Self-pay | Source: Home / Self Care

## 2014-09-06 DIAGNOSIS — N611 Abscess of the breast and nipple: Secondary | ICD-10-CM | POA: Diagnosis present

## 2014-09-06 DIAGNOSIS — N61 Inflammatory disorders of breast: Principal | ICD-10-CM | POA: Insufficient documentation

## 2014-09-06 DIAGNOSIS — F17210 Nicotine dependence, cigarettes, uncomplicated: Secondary | ICD-10-CM | POA: Insufficient documentation

## 2014-09-06 DIAGNOSIS — Z885 Allergy status to narcotic agent status: Secondary | ICD-10-CM | POA: Insufficient documentation

## 2014-09-06 HISTORY — PX: INCISION AND DRAINAGE OF WOUND: SHX1803

## 2014-09-06 LAB — CBC WITH DIFFERENTIAL/PLATELET
BASOS ABS: 0 10*3/uL (ref 0.0–0.1)
Basophils Relative: 0 % (ref 0–1)
EOS ABS: 0.1 10*3/uL (ref 0.0–0.7)
EOS PCT: 1 % (ref 0–5)
HCT: 41.7 % (ref 36.0–46.0)
Hemoglobin: 13.8 g/dL (ref 12.0–15.0)
LYMPHS ABS: 1.3 10*3/uL (ref 0.7–4.0)
Lymphocytes Relative: 16 % (ref 12–46)
MCH: 30.9 pg (ref 26.0–34.0)
MCHC: 33.1 g/dL (ref 30.0–36.0)
MCV: 93.5 fL (ref 78.0–100.0)
Monocytes Absolute: 0.8 10*3/uL (ref 0.1–1.0)
Monocytes Relative: 9 % (ref 3–12)
Neutro Abs: 6 10*3/uL (ref 1.7–7.7)
Neutrophils Relative %: 74 % (ref 43–77)
Platelets: 196 10*3/uL (ref 150–400)
RBC: 4.46 MIL/uL (ref 3.87–5.11)
RDW: 13.7 % (ref 11.5–15.5)
WBC: 8.1 10*3/uL (ref 4.0–10.5)

## 2014-09-06 LAB — GRAM STAIN

## 2014-09-06 LAB — BASIC METABOLIC PANEL
ANION GAP: 7 (ref 5–15)
BUN: 9 mg/dL (ref 6–20)
CO2: 19 mmol/L — ABNORMAL LOW (ref 22–32)
Calcium: 8.6 mg/dL — ABNORMAL LOW (ref 8.9–10.3)
Chloride: 110 mmol/L (ref 101–111)
Creatinine, Ser: 0.82 mg/dL (ref 0.44–1.00)
GFR calc Af Amer: >60 mL/min (ref >60)
Glucose, Bld: 113 mg/dL — ABNORMAL HIGH (ref 65–99)
Potassium: 4.5 mmol/L (ref 3.5–5.1)
Sodium: 136 mmol/L (ref 135–145)

## 2014-09-06 SURGERY — IRRIGATION AND DEBRIDEMENT WOUND
Anesthesia: General | Site: Chest

## 2014-09-06 MED ORDER — MORPHINE SULFATE 4 MG/ML IJ SOLN
4.0000 mg | Freq: Once | INTRAMUSCULAR | Status: AC
  Administered 2014-09-06: 4 mg via INTRAVENOUS
  Filled 2014-09-06: qty 1

## 2014-09-06 MED ORDER — FENTANYL CITRATE (PF) 250 MCG/5ML IJ SOLN
INTRAMUSCULAR | Status: AC
  Filled 2014-09-06: qty 5

## 2014-09-06 MED ORDER — ONDANSETRON HCL 4 MG/2ML IJ SOLN
INTRAMUSCULAR | Status: DC | PRN
  Administered 2014-09-06: 4 mg via INTRAVENOUS

## 2014-09-06 MED ORDER — OXYCODONE-ACETAMINOPHEN 5-325 MG PO TABS: 1.0000 | ORAL_TABLET | ORAL | Status: DC | PRN

## 2014-09-06 MED ORDER — LIDOCAINE HCL (CARDIAC) 20 MG/ML IV SOLN
INTRAVENOUS | Status: AC
  Filled 2014-09-06: qty 5

## 2014-09-06 MED ORDER — SUCCINYLCHOLINE CHLORIDE 20 MG/ML IJ SOLN
INTRAMUSCULAR | Status: DC | PRN
  Administered 2014-09-06: 100 mg via INTRAVENOUS

## 2014-09-06 MED ORDER — GLYCOPYRROLATE 0.2 MG/ML IJ SOLN
INTRAMUSCULAR | Status: DC | PRN
  Administered 2014-09-06: 0.6 mg via INTRAVENOUS

## 2014-09-06 MED ORDER — ONDANSETRON HCL 4 MG/2ML IJ SOLN
4.0000 mg | INTRAMUSCULAR | Status: DC | PRN
  Administered 2014-09-06 – 2014-09-07 (×3): 4 mg via INTRAVENOUS
  Filled 2014-09-06 (×3): qty 2

## 2014-09-06 MED ORDER — PROPOFOL 10 MG/ML IV BOLUS
INTRAVENOUS | Status: DC | PRN
  Administered 2014-09-06: 200 mg via INTRAVENOUS

## 2014-09-06 MED ORDER — ONDANSETRON HCL 4 MG PO TABS: 4.0000 mg | ORAL_TABLET | Freq: Four times a day (QID) | ORAL | Status: DC | PRN

## 2014-09-06 MED ORDER — MORPHINE SULFATE 2 MG/ML IJ SOLN
2.0000 mg | INTRAMUSCULAR | Status: DC | PRN
  Administered 2014-09-06: 6 mg via INTRAVENOUS
  Administered 2014-09-06: 2 mg via INTRAVENOUS
  Administered 2014-09-07 (×3): 6 mg via INTRAVENOUS
  Filled 2014-09-06 (×3): qty 3
  Filled 2014-09-06: qty 1
  Filled 2014-09-06: qty 3

## 2014-09-06 MED ORDER — HYDROMORPHONE HCL 1 MG/ML IJ SOLN
INTRAMUSCULAR | Status: AC
  Filled 2014-09-06: qty 1

## 2014-09-06 MED ORDER — ROCURONIUM BROMIDE 100 MG/10ML IV SOLN
INTRAVENOUS | Status: DC | PRN
  Administered 2014-09-06: 10 mg via INTRAVENOUS

## 2014-09-06 MED ORDER — NEOSTIGMINE METHYLSULFATE 10 MG/10ML IV SOLN
INTRAVENOUS | Status: DC | PRN
  Administered 2014-09-06: 4 mg via INTRAVENOUS

## 2014-09-06 MED ORDER — LACTATED RINGERS IV SOLN
INTRAVENOUS | Status: DC | PRN
  Administered 2014-09-06: 14:00:00 via INTRAVENOUS

## 2014-09-06 MED ORDER — LIDOCAINE HCL (CARDIAC) 20 MG/ML IV SOLN
INTRAVENOUS | Status: DC | PRN
  Administered 2014-09-06: 100 mg via INTRAVENOUS

## 2014-09-06 MED ORDER — PIPERACILLIN-TAZOBACTAM 3.375 G IVPB
3.3750 g | Freq: Three times a day (TID) | INTRAVENOUS | Status: DC
  Administered 2014-09-06 – 2014-09-07 (×3): 3.375 g via INTRAVENOUS
  Filled 2014-09-06 (×4): qty 50

## 2014-09-06 MED ORDER — KCL-LACTATED RINGERS-D5W 20 MEQ/L IV SOLN
INTRAVENOUS | Status: DC
  Administered 2014-09-06: 18:00:00 via INTRAVENOUS
  Filled 2014-09-06 (×2): qty 1000

## 2014-09-06 MED ORDER — DEXAMETHASONE SODIUM PHOSPHATE 10 MG/ML IJ SOLN
INTRAMUSCULAR | Status: DC | PRN
  Administered 2014-09-06: 10 mg via INTRAVENOUS

## 2014-09-06 MED ORDER — FENTANYL CITRATE (PF) 100 MCG/2ML IJ SOLN
INTRAMUSCULAR | Status: DC | PRN
  Administered 2014-09-06 (×4): 50 ug via INTRAVENOUS

## 2014-09-06 MED ORDER — MIDAZOLAM HCL 5 MG/5ML IJ SOLN
INTRAMUSCULAR | Status: DC | PRN
  Administered 2014-09-06: 2 mg via INTRAVENOUS

## 2014-09-06 MED ORDER — FENTANYL CITRATE (PF) 100 MCG/2ML IJ SOLN
25.0000 ug | INTRAMUSCULAR | Status: DC | PRN
  Administered 2014-09-06 (×2): 25 ug via INTRAVENOUS
  Administered 2014-09-06: 50 ug via INTRAVENOUS

## 2014-09-06 MED ORDER — VANCOMYCIN HCL 10 G IV SOLR
1500.0000 mg | INTRAVENOUS | Status: AC
  Administered 2014-09-06: 1500 mg via INTRAVENOUS
  Filled 2014-09-06: qty 1500

## 2014-09-06 MED ORDER — MIDAZOLAM HCL 2 MG/2ML IJ SOLN
INTRAMUSCULAR | Status: AC
  Filled 2014-09-06: qty 2

## 2014-09-06 MED ORDER — LACTATED RINGERS IV SOLN: INTRAVENOUS | Status: DC

## 2014-09-06 MED ORDER — PROPOFOL 10 MG/ML IV BOLUS
INTRAVENOUS | Status: AC
  Filled 2014-09-06: qty 20

## 2014-09-06 MED ORDER — VANCOMYCIN HCL IN DEXTROSE 1-5 GM/200ML-% IV SOLN
1000.0000 mg | Freq: Three times a day (TID) | INTRAVENOUS | Status: DC
  Administered 2014-09-07 (×2): 1000 mg via INTRAVENOUS
  Filled 2014-09-06 (×4): qty 200

## 2014-09-06 MED ORDER — ONDANSETRON HCL 4 MG/2ML IJ SOLN
INTRAMUSCULAR | Status: AC
  Filled 2014-09-06: qty 2

## 2014-09-06 MED ORDER — HYDROMORPHONE HCL 1 MG/ML IJ SOLN
0.2500 mg | INTRAMUSCULAR | Status: DC | PRN
  Administered 2014-09-06: 0.25 mg via INTRAVENOUS
  Administered 2014-09-06: 0.5 mg via INTRAVENOUS
  Administered 2014-09-06: 0.25 mg via INTRAVENOUS

## 2014-09-06 MED ORDER — DEXAMETHASONE SODIUM PHOSPHATE 10 MG/ML IJ SOLN
INTRAMUSCULAR | Status: AC
  Filled 2014-09-06: qty 1

## 2014-09-06 MED ORDER — FENTANYL CITRATE (PF) 100 MCG/2ML IJ SOLN
INTRAMUSCULAR | Status: AC
  Filled 2014-09-06: qty 2

## 2014-09-06 SURGICAL SUPPLY — 24 items
BNDG GAUZE ELAST 4 BULKY (GAUZE/BANDAGES/DRESSINGS) IMPLANT
COVER SURGICAL LIGHT HANDLE (MISCELLANEOUS) ×3 IMPLANT
DRAPE EXTREMITY T 121X128X90 (DRAPE) IMPLANT
DRAPE SHEET LG 3/4 BI-LAMINATE (DRAPES) ×3 IMPLANT
ELECT REM PT RETURN 9FT ADLT (ELECTROSURGICAL) ×3
ELECTRODE REM PT RTRN 9FT ADLT (ELECTROSURGICAL) ×1 IMPLANT
GAUZE SPONGE 4X4 12PLY STRL (GAUZE/BANDAGES/DRESSINGS) ×3 IMPLANT
GAUZE SPONGE 4X4 16PLY XRAY LF (GAUZE/BANDAGES/DRESSINGS) ×2 IMPLANT
GLOVE BIOGEL PI IND STRL 7.0 (GLOVE) ×1 IMPLANT
GLOVE BIOGEL PI INDICATOR 7.0 (GLOVE) ×2
GLOVE ECLIPSE 8.0 STRL XLNG CF (GLOVE) ×3 IMPLANT
GLOVE INDICATOR 8.0 STRL GRN (GLOVE) ×6 IMPLANT
GOWN STRL REUS W/TWL LRG LVL3 (GOWN DISPOSABLE) ×3 IMPLANT
GOWN STRL REUS W/TWL XL LVL3 (GOWN DISPOSABLE) ×6 IMPLANT
KIT BASIN OR (CUSTOM PROCEDURE TRAY) ×3 IMPLANT
NS IRRIG 1000ML POUR BTL (IV SOLUTION) ×3 IMPLANT
PACK GENERAL/GYN (CUSTOM PROCEDURE TRAY) ×3 IMPLANT
PAD ABD 8X10 STRL (GAUZE/BANDAGES/DRESSINGS) ×2 IMPLANT
STOCKINETTE 8 INCH (MISCELLANEOUS) ×2 IMPLANT
SWAB COLLECTION DEVICE MRSA (MISCELLANEOUS) ×2 IMPLANT
TAPE CLOTH SURG 4X10 WHT LF (GAUZE/BANDAGES/DRESSINGS) ×2 IMPLANT
TOWEL OR 17X26 10 PK STRL BLUE (TOWEL DISPOSABLE) ×3 IMPLANT
TUBE ANAEROBIC SPECIMEN COL (MISCELLANEOUS) ×2 IMPLANT
UNDERPAD 30X30 INCONTINENT (UNDERPADS AND DIAPERS) IMPLANT

## 2014-09-06 NOTE — Op Note (Signed)
Operative Note  [name] female 44 y.o. 09/06/2014  PREOPERATIVE DX:  Recurrent left breast abscess  POSTOPERATIVE DX:  Same  PROCEDURE:   Incision and drainage of recurrent left breast abscess         Surgeon: Odis Hollingshead   Assistants: none  Anesthesia: General LMA anesthesia  Indications:   This is a 45 year old female with recurrent left breast abscesses. She last had a left breast abscess drained in the same area in 2015. She continues to smoke. She now presents for incision and drainage of this recurrent left breast abscess.    Procedure Detail:  She was seen and left breast marked with my initials. She was brought to the operating room placed supine on the operating table and a general anesthetic was given. The left breast was sterilely prepped and draped. A timeout was performed.  There was a fluctuant area deep to the previous superior scar. Needle aspiration of this area was performed and purulent fluid was evacuated and sent for culture. I then re-incised superior scar and drained purulent fluid. The abscess cavity went underneath the nipple. No loculations were noted. The abscess cavity was then copiously irrigated. A 1/4 inch Penrose drain was then placed into the abscess cavity. It was anchored to the skin by way of a 4-0 nylon suture and a safety pin. A bulky dressing was applied.  She tolerated the procedure well without any apparent complications and was taken to the recovery room in satisfactory condition.         Specimens: Abscess fluid sent for culture        Complications:  * No complications entered in OR log *         Disposition: PACU - hemodynamically stable.         Condition: stable

## 2014-09-06 NOTE — Anesthesia Preprocedure Evaluation (Addendum)
Anesthesia Evaluation  Patient identified by MRN, date of birth, ID band Patient awake    Reviewed: Allergy & Precautions, NPO status , Patient's Chart, lab work & pertinent test results  Airway Mallampati: II  TM Distance: >3 FB Neck ROM: Full    Dental no notable dental hx.    Pulmonary Current Smoker,  breath sounds clear to auscultation  Pulmonary exam normal       Cardiovascular negative cardio ROS Normal cardiovascular examRhythm:Regular Rate:Normal     Neuro/Psych negative neurological ROS  negative psych ROS   GI/Hepatic negative GI ROS, Neg liver ROS,   Endo/Other  negative endocrine ROS  Renal/GU negative Renal ROS  negative genitourinary   Musculoskeletal negative musculoskeletal ROS (+)   Abdominal   Peds negative pediatric ROS (+)  Hematology negative hematology ROS (+)   Anesthesia Other Findings   Reproductive/Obstetrics negative OB ROS                            Anesthesia Physical Anesthesia Plan  ASA: II  Anesthesia Plan: General   Post-op Pain Management:    Induction: Intravenous  Airway Management Planned: Oral ETT  Additional Equipment:   Intra-op Plan:   Post-operative Plan: Extubation in OR  Informed Consent: I have reviewed the patients History and Physical, chart, labs and discussed the procedure including the risks, benefits and alternatives for the proposed anesthesia with the patient or authorized representative who has indicated his/her understanding and acceptance.   Dental advisory given  Plan Discussed with: CRNA  Anesthesia Plan Comments:         Anesthesia Quick Evaluation

## 2014-09-06 NOTE — ED Notes (Signed)
Per patient states left breast pain since last Friday-history of abscesses

## 2014-09-06 NOTE — ED Provider Notes (Signed)
CSN: [id number]     Arrival date & time 09/06/14  4098 History   First MD Initiated Contact with Patient 09/06/14 1038     Chief Complaint  Patient presents with  . Breast Pain     (Consider location/radiation/quality/duration/timing/severity/associated sxs/prior Treatment) HPI Comments: [name] is a 44 y.o. female with a PMHx of L breast abscess s/p I&D in 09/2013, and rheumatic fever, who presents to the ED with complaints of left breast pain 6 days. She states that she noticed purulent drainage from the nipple and a knot just superior to the nipple over a prior surgical incision on Tuesday. She reports the pain is 10/10 located in the left breast, constant, gradually worsening, burning and sharp in nature, nonradiating, worse with touch, minimally improved with Tylenol, and unrelieved with ibuprofen. Associated symptoms include erythema, warmth,. Went nipple discharge, and induration of the skin. She denies any skin injuries, breast-feeding, red streaking, insect bites, nipple inversion, lymphadenopathy, fevers, chills, chest pain, shortness breath, abdominal pain, nausea, vomiting, diarrhea, constipation, dysuria, hematuria, vaginal bleeding or discharge, numbness, tingling, or weakness. She is required to prior surgical I&D's after failing outpt management including aspiration and abx. Last year was the last surgery, performed by Dr. Marlou Starks of [facility].   Patient is a 44 y.o. female presenting with general illness. The history is provided by the patient. No language interpreter was used.  Illness Location:  L breast Quality:  Burning/sharp Severity:  Severe Onset quality:  Gradual Duration:  6 days Timing:  Constant Progression:  Worsening Chronicity:  Recurrent Relieved by:  Tylenol (minimal) Worsened by:  Touching area Ineffective treatments:  Ibuprofen Associated symptoms: no abdominal pain, no chest pain, no diarrhea, no fever, no myalgias, no nausea, no shortness of breath and no  vomiting   Risk factors:  Prior mastitis resistent to outpatient management and requiring surgical intervention   Past Medical History  Diagnosis Date  . Abscess   . Rheumatic fever   . History of breast abscess 2013   Past Surgical History  Procedure Laterality Date  . Incise and drain abcess      left breast abscess  . Incision & drainage left chest      left chest  . Scar tissue removed    . Irrigation and debridement abscess Left 10/05/2013    Procedure: IRRIGATION AND DEBRIDEMENT left breast ABSCESS;  Surgeon: Merrie Roof, MD;  Location: [facility];  Service: General;  Laterality: Left;   Family History  Problem Relation Age of Onset  . Heart disease Paternal Grandmother    History  Substance Use Topics  . Smoking status: Current Every Day Smoker -- 0.50 packs/day for 28 years    Types: Cigarettes  . Smokeless tobacco: Not on file  . Alcohol Use: Yes     Comment: 1/2 bottle wine on weekends   OB History    No data available     Review of Systems  Constitutional: Negative for fever and chills.  Respiratory: Negative for shortness of breath.   Cardiovascular: Negative for chest pain.  Gastrointestinal: Negative for nausea, vomiting, abdominal pain, diarrhea and constipation.  Genitourinary: Negative for dysuria, hematuria, vaginal bleeding and vaginal discharge.  Musculoskeletal: Negative for myalgias and arthralgias.  Skin: Positive for color change (L breast erythema).       +L breast erythema, warmth, induration, and nipple discharge  Allergic/Immunologic: Negative for immunocompromised state.  Neurological: Negative for weakness and numbness.  Psychiatric/Behavioral: Negative for confusion.   10 Systems reviewed  and are negative for acute change except as noted in the HPI.    Allergies  Hydrocodone  Home Medications   Prior to Admission medications   Not on File   BP 111/73 mmHg  Pulse 87  Temp(Src) 98.6 F (37 C) (Oral)  Resp 18  SpO2 100%  LMP  09/05/2014 Physical Exam  Constitutional: She is oriented to person, place, and time. Vital signs are normal. She appears well-developed and well-nourished.  Non-toxic appearance. No distress.  Afebrile, nontoxic, NAD  HENT:  Head: Normocephalic and atraumatic.  Mouth/Throat: Mucous membranes are normal.  Eyes: Conjunctivae and EOM are normal. Right eye exhibits no discharge. Left eye exhibits no discharge.  Neck: Normal range of motion. Neck supple.  Cardiovascular: Normal rate.   Pulmonary/Chest: Effort normal. No respiratory distress. Left breast exhibits mass, nipple discharge (yellowish), skin change (erythematous) and tenderness. Left breast exhibits no inverted nipple.    L breast with erythema, warmth, yellowish nipple discharge without nipple inversion, with 3cm x 2cm area of induration to upper outer region and small central ?fluctuance at the site of prior incision. Very TTP. No peau-de-orange.   Abdominal: Soft. Normal appearance. She exhibits no distension. There is no tenderness. There is no rigidity, no rebound and no guarding.  Musculoskeletal: Normal range of motion.  Lymphadenopathy:    She has axillary adenopathy.       Left axillary: Lateral (stable per pt) adenopathy present. No pectoral adenopathy present. Stable solitary lymph node in axilla, chronic and unchanged per pt, nonTTP, soft and mobile.  Neurological: She is alert and oriented to person, place, and time. She has normal strength. No sensory deficit.  Skin: Skin is warm, dry and intact. No rash noted. There is erythema.  L breast erythema as discussed above  Psychiatric: She has a normal mood and affect. Her behavior is normal.  Nursing note and vitals reviewed.   ED Course  Procedures (including critical care time) Labs Review Labs Reviewed  BASIC METABOLIC PANEL - Abnormal; Notable for the following:    CO2 19 (*)    Glucose, Bld 113 (*)    Calcium 8.6 (*)    All other components within normal  limits  CBC WITH DIFFERENTIAL/PLATELET    Imaging Review No results found.   EKG Interpretation None      MDM   Final diagnoses:  Left breast abscess  Mastitis    44 y.o. female here with L breast abscess/mastitis x6 days, same as prior episodes, she has required two surgical I&Ds in the past for same after failing outpt management on Clindamycin. On exam, 3cm x 2cm area of induration with small central fluctuance at site of surgical incision. Very tender, erythematous and warm to touch, no surrounding LAD. Given her hx of difficult to treat mastitis, will get labs and give pain meds and discuss with Dr. Aline Brochure on whether to proceed directly to surgical consultation. Will hold on abx until decision made regarding surgical consultation. Will reassess shortly   11:48 AM Dr. Aline Brochure in to evaluate pt, agrees with plan of consulting surgeons to discuss case given her prior hx of multiple I&Ds after failing outpt management.  12:28 PM Maximiano Coss from surgery returning page, will come consult on pt. Labs unremarkable. Will reassess shortly.   1:18 PM Saverio Danker PA-C placing orders for admission. Please see her note for further documentation of care.  BP 110/73 mmHg  Pulse 82  Temp(Src) 98.6 F (37 C) (Oral)  Resp 16  SpO2  96%  LMP 09/05/2014  Meds ordered this encounter  Medications  . morphine 4 MG/ML injection 4 mg    Sig:   . morphine 4 MG/ML injection 4 mg    Sig:   . piperacillin-tazobactam (ZOSYN) IVPB 3.375 g    Sig:     Order Specific Question:  Antibiotic Indication:    Answer:  Other Indication (list below)    Order Specific Question:  Other Indication:    Answer:  breast abscess     Jaeshawn Silvio Camprubi-Soms, PA-C 09/06/14 [place], MD 09/07/14 2247

## 2014-09-06 NOTE — ED Notes (Signed)
Mercedes PA at bedside  

## 2014-09-06 NOTE — H&P (Signed)
[name] December 01, 1970  [id number].  Chief Complaint/Reason for Consult: left breast abscess HPI: This is a 44 yo black female with a history of left breast mastitis and abscess that have had prior aspirations and I&Ds.  Most recent was in 2015 by Dr. Marlou Starks.  The patient has been doing well since then.  Last week the patient noticed some purulent drainage from her nipple, but no pain and did not seek treatment.  Throughout the week, her breast continued to redden and her nipple inverted; however, it is no longer inverted.  The pain has continued to worsen and she presented to the [facility] for evaluation.  We have been asked to see her.  ROS : Please see HPI, otherwise negative  Family History  Problem Relation Age of Onset  . Heart disease Paternal Grandmother     Past Medical History  Diagnosis Date  . Abscess   . Rheumatic fever   . History of breast abscess 2013    Past Surgical History  Procedure Laterality Date  . Incise and drain abcess      left breast abscess  . Incision & drainage left chest      left chest  . Scar tissue removed    . Irrigation and debridement abscess Left 10/05/2013    Procedure: IRRIGATION AND DEBRIDEMENT left breast ABSCESS;  Surgeon: Merrie Roof, MD;  Location: [facility];  Service: General;  Laterality: Left;    Social History:  reports that she has been smoking Cigarettes.  She has a 14 pack-year smoking history. She does not have any smokeless tobacco history on file. She reports that she drinks alcohol. She reports that she does not use illicit drugs.  Allergies:  Allergies  Allergen Reactions  . Hydrocodone Itching    Pt tolerates oxycodone     (Not in a hospital admission)  Blood pressure 110/73, pulse 82, temperature 98.6 F (37 C), temperature source Oral, resp. rate 16, last menstrual period 09/05/2014, SpO2 96 %. Physical Exam: General: pleasant, WD, WN black female who is laying in bed in NAD HEENT: head is normocephalic,  atraumatic.  Sclera are noninjected.  PERRL.  Ears and nose without any masses or lesions.  Mouth is pink and moist Heart: regular, rate, and rhythm.  Normal s1,s2. No obvious murmurs, gallops, or rubs noted.  Palpable radial and pedal pulses bilaterally Lungs: CTAB, no wheezes, rhonchi, or rales noted.  Respiratory effort nonlabored Breast: left breast with erythema covering the top half of her breast.  She has a fluctuant abscess at 12 oclock just superior to areola.  She is so tender that she will not even let me touch this area. Abd: soft, NT, ND, +BS, no masses, hernias, or organomegaly MS: all 4 extremities are symmetrical with no cyanosis, clubbing, or edema. Skin: warm and dry with no masses, lesions, or rashes Psych: A&Ox3 with an appropriate affect.    Results for orders placed or performed during the hospital encounter of 09/06/14 (from the past 48 hour(s))  CBC with Differential     Status: None   Collection Time: 09/06/14 11:15 AM  Result Value Ref Range   WBC 8.1 4.0 - 10.5 K/uL   RBC 4.46 3.87 - 5.11 MIL/uL   Hemoglobin 13.8 12.0 - 15.0 g/dL   HCT 41.7 36.0 - 46.0 %   MCV 93.5 78.0 - 100.0 fL   MCH 30.9 26.0 - 34.0 pg   MCHC 33.1 30.0 - 36.0 g/dL   RDW 13.7 11.5 -  15.5 %   Platelets 196 150 - 400 K/uL   Neutrophils Relative % 74 43 - 77 %   Neutro Abs 6.0 1.7 - 7.7 K/uL   Lymphocytes Relative 16 12 - 46 %   Lymphs Abs 1.3 0.7 - 4.0 K/uL   Monocytes Relative 9 3 - 12 %   Monocytes Absolute 0.8 0.1 - 1.0 K/uL   Eosinophils Relative 1 0 - 5 %   Eosinophils Absolute 0.1 0.0 - 0.7 K/uL   Basophils Relative 0 0 - 1 %   Basophils Absolute 0.0 0.0 - 0.1 K/uL  Basic metabolic panel     Status: Abnormal   Collection Time: 09/06/14 11:15 AM  Result Value Ref Range   Sodium 136 135 - 145 mmol/L   Potassium 4.5 3.5 - 5.1 mmol/L   Chloride 110 101 - 111 mmol/L   CO2 19 (L) 22 - 32 mmol/L   Glucose, Bld 113 (H) 65 - 99 mg/dL   BUN 9 6 - 20 mg/dL   Creatinine, Ser 0.82 0.44  - 1.00 mg/dL   Calcium 8.6 (L) 8.9 - 10.3 mg/dL   GFR calc non Af Amer >60 >60 mL/min   GFR calc Af Amer >60 >60 mL/min    Comment: (NOTE) The eGFR has been calculated using the CKD EPI equation. This calculation has not been validated in all clinical situations. eGFR's persistently <60 mL/min signify possible Chronic Kidney Disease.    Anion gap 7 5 - 15   No results found.     Assessment/Plan 1. Left breast abscess, recurrent -will admit and place on IV vanc and zosyn given h/o diphtheroid and proteus -to OR today for I&D - have counseled the patient on smoking cessation as this is a likely contributing to her recurrence.   Lejuan Botto E 09/06/2014, 1:09 PM Pager: [phone]

## 2014-09-06 NOTE — Transfer of Care (Signed)
Immediate Anesthesia Transfer of Care Note  Patient: [name]  Procedure(s) Performed: Procedure(s): IRRIGATION AND DEBRIDEMENT LEFT BREAST ABCESS (N/A)  Patient Location: PACU  Anesthesia Type:General  Level of Consciousness: awake, alert , oriented and patient cooperative  Airway & Oxygen Therapy: Patient Spontanous Breathing and Patient connected to face mask oxygen  Post-op Assessment: Report given to RN, Post -op Vital signs reviewed and stable and Patient moving all extremities  Post vital signs: Reviewed and stable  Last Vitals:  Filed Vitals:   09/06/14 1339  BP: 112/75  Pulse: 88  Temp:   Resp: 18    Complications: No apparent anesthesia complications

## 2014-09-06 NOTE — Anesthesia Procedure Notes (Signed)
Procedure Name: Intubation Date/Time: 09/06/2014 2:09 PM Performed by: Carleene Cooper A Pre-anesthesia Checklist: Patient identified, Emergency Drugs available, Suction available, Patient being monitored and Timeout performed Patient Re-evaluated:Patient Re-evaluated prior to inductionOxygen Delivery Method: Circle system utilized Preoxygenation: Pre-oxygenation with 100% oxygen (RSI with cricoid pressure by Dr. Delma Post) Intubation Type: IV induction, Cricoid Pressure applied and Rapid sequence Laryngoscope Size: Mac and 4 Grade View: Grade I Tube type: Oral Tube size: 7.5 mm Number of attempts: 1 Airway Equipment and Method: Stylet Placement Confirmation: ETT inserted through vocal cords under direct vision,  positive ETCO2 and breath sounds checked- equal and bilateral Secured at: 21 cm Tube secured with: Tape Dental Injury: Teeth and Oropharynx as per pre-operative assessment

## 2014-09-06 NOTE — Anesthesia Postprocedure Evaluation (Signed)
  Anesthesia Post-op Note  Patient: [name]  Procedure(s) Performed: Procedure(s) (LRB): IRRIGATION AND DEBRIDEMENT LEFT BREAST ABCESS (N/A)  Patient Location: PACU  Anesthesia Type: General  Level of Consciousness: awake and alert   Airway and Oxygen Therapy: Patient Spontanous Breathing  Post-op Pain: mild  Post-op Assessment: Post-op Vital signs reviewed, Patient's Cardiovascular Status Stable, Respiratory Function Stable, Patent Airway and No signs of Nausea or vomiting  Last Vitals:  Filed Vitals:   09/06/14 1606  BP: 115/70  Pulse: 67  Temp: 36.6 C  Resp: 14    Post-op Vital Signs: stable   Complications: No apparent anesthesia complications

## 2014-09-06 NOTE — Progress Notes (Signed)
ANTIBIOTIC CONSULT NOTE - INITIAL  Pharmacy Consult for Vancomycin Indication: Breast infection  Allergies  Allergen Reactions  . Hydrocodone Itching    Pt tolerates oxycodone    Patient Measurements:   Height: 5'7" Weight: 80 kg  Vital Signs: Temp: 98.6 F (37 C) (06/20 1151) Temp Source: Oral (06/20 1151) BP: 110/73 mmHg (06/20 1151) Pulse Rate: 82 (06/20 1151) Intake/Output from previous day:   Intake/Output from this shift:    Labs:  Recent Labs  09/06/14 1115  WBC 8.1  HGB 13.8  PLT 196  CREATININE 0.82   CrCl cannot be calculated (Unknown ideal weight.). No results for input(s): VANCOTROUGH, VANCOPEAK, VANCORANDOM, GENTTROUGH, GENTPEAK, GENTRANDOM, TOBRATROUGH, TOBRAPEAK, TOBRARND, AMIKACINPEAK, AMIKACINTROU, AMIKACIN in the last 72 hours.   Microbiology: No results found for this or any previous visit (from the past 720 hour(s)).  Medical History: Past Medical History  Diagnosis Date  . Abscess   . Rheumatic fever   . History of breast abscess 2013    Medications:  Scheduled:   Infusions:  . piperacillin-tazobactam (ZOSYN)  IV    . vancomycin     PRN:   Assessment: 44 yo female with hx of left breast mastitis and abscess with prior aspirations and I&Ds, most recent in 2015. Pt c/o purulent drainage from her nipple since last week. Pharmacy is consulted to dose vancomycin and patient is scheduled for I&D in OR today.  6/20 >> Vancomycin >> 6/20 >> Zosyn x 1 in ED  Afebrile, WBC wnl, SCr wnl, CrCl ~ 100 ml/min/1.77m2 (normalized), no cultures currently available  Goal of Therapy:  Vancomycin trough level 15-20 mcg/ml until abscess ruled out  Plan:   Vancomycin 1500mg  IV x 1, then 1g IV q8h Check trough at steady state Follow up renal function & cultures, clinical course Continue Zosyn on admission?  Peggyann Juba, PharmD, BCPS Pager: [phone] 09/06/2014,1:33 PM

## 2014-09-07 ENCOUNTER — Encounter (HOSPITAL_COMMUNITY): Payer: Self-pay | Admitting: General Surgery

## 2014-09-07 LAB — BASIC METABOLIC PANEL
Anion gap: 7 (ref 5–15)
BUN: 9 mg/dL (ref 6–20)
CO2: 21 mmol/L — AB (ref 22–32)
Calcium: 9 mg/dL (ref 8.9–10.3)
Chloride: 108 mmol/L (ref 101–111)
Creatinine, Ser: 0.91 mg/dL (ref 0.44–1.00)
GFR calc Af Amer: >60 mL/min (ref >60)
GFR calc non Af Amer: >60 mL/min (ref >60)
GLUCOSE: 124 mg/dL — AB (ref 65–99)
POTASSIUM: 4.4 mmol/L (ref 3.5–5.1)
SODIUM: 136 mmol/L (ref 135–145)

## 2014-09-07 MED ORDER — DOXYCYCLINE HYCLATE 50 MG PO CAPS: 100.0000 mg | ORAL_CAPSULE | Freq: Two times a day (BID) | ORAL | Status: DC

## 2014-09-07 MED ORDER — OXYCODONE-ACETAMINOPHEN 5-325 MG PO TABS: 1.0000 | ORAL_TABLET | ORAL | Status: DC | PRN

## 2014-09-07 NOTE — Discharge Instructions (Signed)
Change outer dressing with dry gauze daily  Smoking Cessation Quitting smoking is important to your health and has many advantages. However, it is not always easy to quit since nicotine is a very addictive drug. Oftentimes, people try 3 times or more before being able to quit. This document explains the best ways for you to prepare to quit smoking. Quitting takes hard work and a lot of effort, but you can do it. ADVANTAGES OF QUITTING SMOKING  You will live longer, feel better, and live better.  Your body will feel the impact of quitting smoking almost immediately.  Within 20 minutes, blood pressure decreases. Your pulse returns to its normal level.  After 8 hours, carbon monoxide levels in the blood return to normal. Your oxygen level increases.  After 24 hours, the chance of having a heart attack starts to decrease. Your breath, hair, and body stop smelling like smoke.  After 48 hours, damaged nerve endings begin to recover. Your sense of taste and smell improve.  After 72 hours, the body is virtually free of nicotine. Your bronchial tubes relax and breathing becomes easier.  After 2 to 12 weeks, lungs can hold more air. Exercise becomes easier and circulation improves.  The risk of having a heart attack, stroke, cancer, or lung disease is greatly reduced.  After 1 year, the risk of coronary heart disease is cut in half.  After 5 years, the risk of stroke falls to the same as a nonsmoker.  After 10 years, the risk of lung cancer is cut in half and the risk of other cancers decreases significantly.  After 15 years, the risk of coronary heart disease drops, usually to the level of a nonsmoker.  If you are pregnant, quitting smoking will improve your chances of having a healthy baby.  The people you live with, especially any children, will be healthier.  You will have extra money to spend on things other than cigarettes. QUESTIONS TO THINK ABOUT BEFORE ATTEMPTING TO QUIT You may  want to talk about your answers with your health care provider.  Why do you want to quit?  If you tried to quit in the past, what helped and what did not?  What will be the most difficult situations for you after you quit? How will you plan to handle them?  Who can help you through the tough times? Your family? Friends? A health care provider?  What pleasures do you get from smoking? What ways can you still get pleasure if you quit? Here are some questions to ask your health care provider:  How can you help me to be successful at quitting?  What medicine do you think would be best for me and how should I take it?  What should I do if I need more help?  What is smoking withdrawal like? How can I get information on withdrawal? GET READY  Set a quit date.  Change your environment by getting rid of all cigarettes, ashtrays, matches, and lighters in your home, car, or work. Do not let people smoke in your home.  Review your past attempts to quit. Think about what worked and what did not. GET SUPPORT AND ENCOURAGEMENT You have a better chance of being successful if you have help. You can get support in many ways.  Tell your family, friends, and coworkers that you are going to quit and need their support. Ask them not to smoke around you.  Get individual, group, or telephone counseling and support. Programs are  available at local hospitals and health centers. Call your local health department for information about programs in your area.  Spiritual beliefs and practices may help some smokers quit.  Download a "quit meter" on your computer to keep track of quit statistics, such as how long you have gone without smoking, cigarettes not smoked, and money saved.  Get a self-help book about quitting smoking and staying off tobacco. Pierpoint yourself from urges to smoke. Talk to someone, go for a walk, or occupy your time with a task.  Change your normal  routine. Take a different route to work. Drink tea instead of coffee. Eat breakfast in a different place.  Reduce your stress. Take a hot bath, exercise, or read a book.  Plan something enjoyable to do every day. Reward yourself for not smoking.  Explore interactive web-based programs that specialize in helping you quit. GET MEDICINE AND USE IT CORRECTLY Medicines can help you stop smoking and decrease the urge to smoke. Combining medicine with the above behavioral methods and support can greatly increase your chances of successfully quitting smoking.  Nicotine replacement therapy helps deliver nicotine to your body without the negative effects and risks of smoking. Nicotine replacement therapy includes nicotine gum, lozenges, inhalers, nasal sprays, and skin patches. Some may be available over-the-counter and others require a prescription.  Antidepressant medicine helps people abstain from smoking, but how this works is unknown. This medicine is available by prescription.  Nicotinic receptor partial agonist medicine simulates the effect of nicotine in your brain. This medicine is available by prescription. Ask your health care provider for advice about which medicines to use and how to use them based on your health history. Your health care provider will tell you what side effects to look out for if you choose to be on a medicine or therapy. Carefully read the information on the package. Do not use any other product containing nicotine while using a nicotine replacement product.  RELAPSE OR DIFFICULT SITUATIONS Most relapses occur within the first 3 months after quitting. Do not be discouraged if you start smoking again. Remember, most people try several times before finally quitting. You may have symptoms of withdrawal because your body is used to nicotine. You may crave cigarettes, be irritable, feel very hungry, cough often, get headaches, or have difficulty concentrating. The withdrawal  symptoms are only temporary. They are strongest when you first quit, but they will go away within 10-14 days. To reduce the chances of relapse, try to:  Avoid drinking alcohol. Drinking lowers your chances of successfully quitting.  Reduce the amount of caffeine you consume. Once you quit smoking, the amount of caffeine in your body increases and can give you symptoms, such as a rapid heartbeat, sweating, and anxiety.  Avoid smokers because they can make you want to smoke.  Do not let weight gain distract you. Many smokers will gain weight when they quit, usually less than 10 pounds. Eat a healthy diet and stay active. You can always lose the weight gained after you quit.  Find ways to improve your mood other than smoking. FOR MORE INFORMATION  www.smokefree.gov  Document Released: 02/27/2001 Document Revised: 07/20/2013 Document Reviewed: 06/14/2011 [facility] Patient Information 2015 [place], [place]. This information is not intended to replace advice given to you by your health care provider. Make sure you discuss any questions you have with your health care provider.

## 2014-09-07 NOTE — Discharge Summary (Signed)
Patient ID: Nicolle Heward MRN: [id number] DOB/AGE: 44/04/1970 44 y.o.  Admit date: 09/06/2014 Discharge date: 09/07/2014  Procedures: I&D of left breast abscess with placement of penrose drain  Consults: None  Reason for Admission: This is a 44 yo black female with a history of left breast mastitis and abscess that have had prior aspirations and I&Ds. Most recent was in 2015 by Dr. Marlou Starks. The patient has been doing well since then. Last week the patient noticed some purulent drainage from her nipple, but no pain and did not seek treatment. Throughout the week, her breast continued to redden and her nipple inverted; however, it is no longer inverted. The pain has continued to worsen and she presented to the [facility] for evaluation. We have been asked to see her.  Admission Diagnoses:  1. Left breast abscess 2. Tobacco abuse  Hospital Course: The patient was admitted and placed on IV zosyn and vanc as she has a h/o gram + is her cultures.  She was taken to the OR where she underwent an I*D and placement of a penrose drain.  She tolerated this well and was stable on POD 1, for dc home  PE: Chest: penrose drain in place with no purulent drainage or erythema.  Erythema is much improved from yesterday.  Discharge Diagnoses:  Active Problems:   Left breast abscess s/p I&D with placement of penrose drain  Discharge Medications:   Medication List    TAKE these medications        doxycycline 50 MG capsule  Commonly known as:  VIBRAMYCIN  Take 2 capsules (100 mg total) by mouth 2 (two) times daily.     oxyCODONE-acetaminophen 5-325 MG per tablet  Commonly known as:  PERCOCET/ROXICET  Take 1-2 tablets by mouth every 4 (four) hours as needed for moderate pain.        Discharge Instructions: Follow-up Information    Follow up with Odis Hollingshead, MD On 09/13/2014.   Specialty:  General Surgery   Why:  2:20pm, arrive by 2:00pm for paperwork   Contact information:   [address] [place] [address] [phone]       Follow up with obtain PCP to discuss smoking cessastion and chantix.      Signed: Henreitta Cea 09/07/2014, 9:18 AM

## 2014-09-07 NOTE — Progress Notes (Signed)
DC instructions reviewed with patient. Patient denies further questions or concerns at this time. Rxs given. No changes noted since am assessment. IV DC'ed. Dressing changed to left breast-penrose drain left in place per Saverio Danker. Patient to DC to home.

## 2014-09-08 IMAGING — US US BREAST LTD UNI LEFT INC AXILLA
1 series · 12 of 12 positions shown · non-contrast
Comparison: None

CLINICAL DATA: Left breast tenderness, warmth and pain. History of
breast abscess.

EXAM:
ULTRASOUND OF THE left BREAST

[Series 1: us breast ltd uni left inc axilla · 0.05mm/px · 12 acquisitions, 12 frames shown]
[im 1/12]
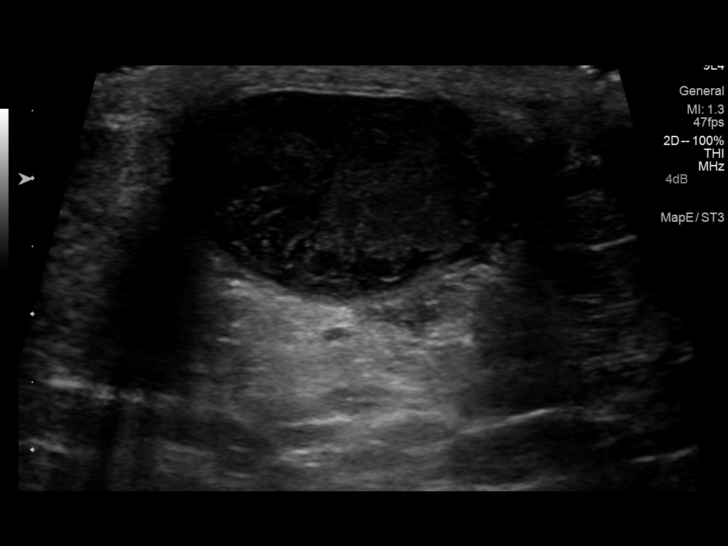
[im 2/12]
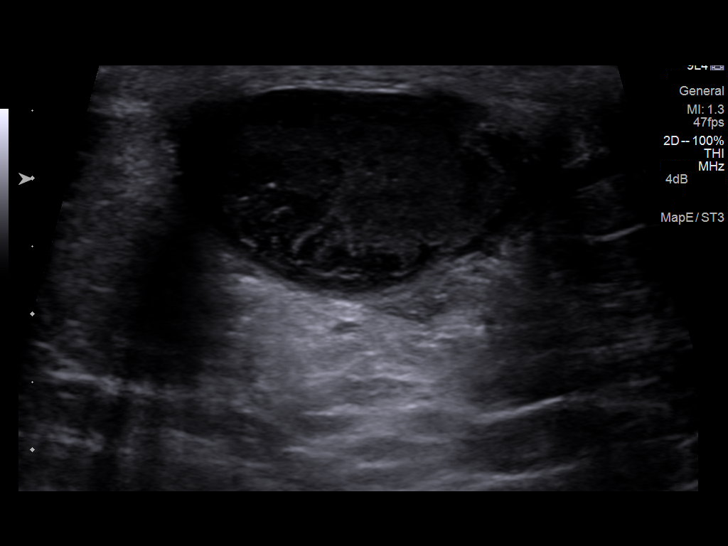
[im 3/12]
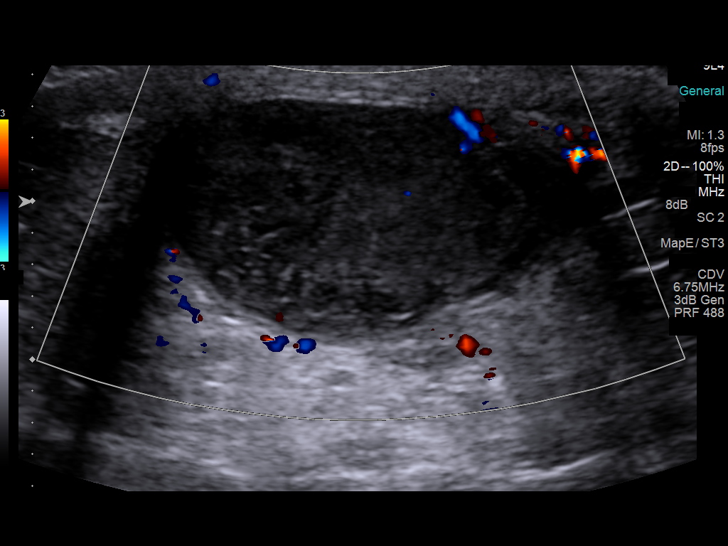
[im 4/12]
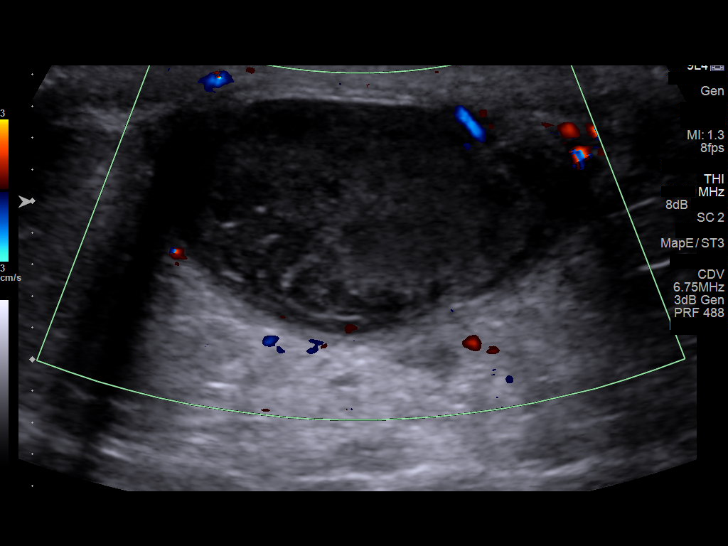
[im 5/12]
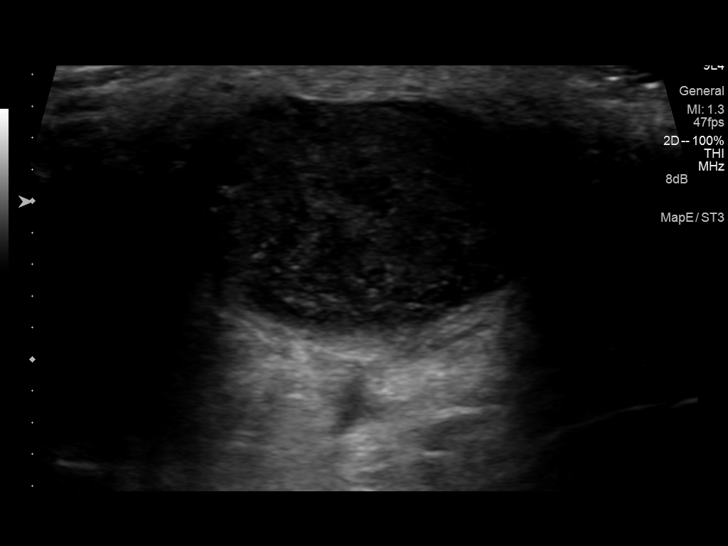
[im 6/12]
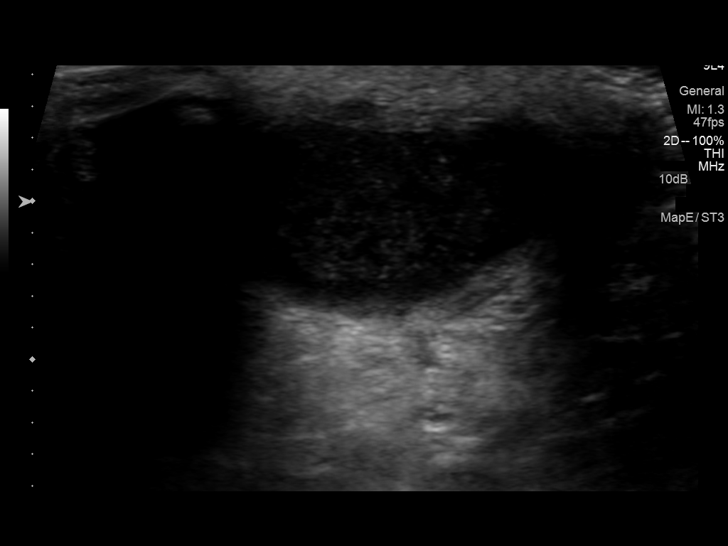
[im 7/12]
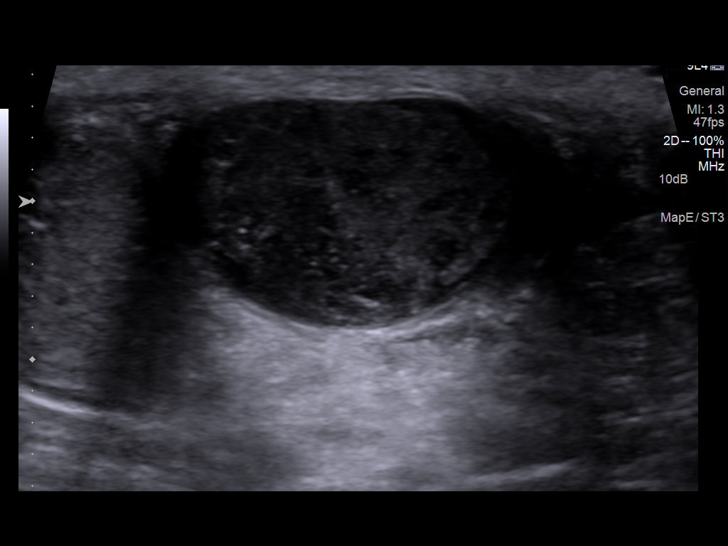
[im 8/12]
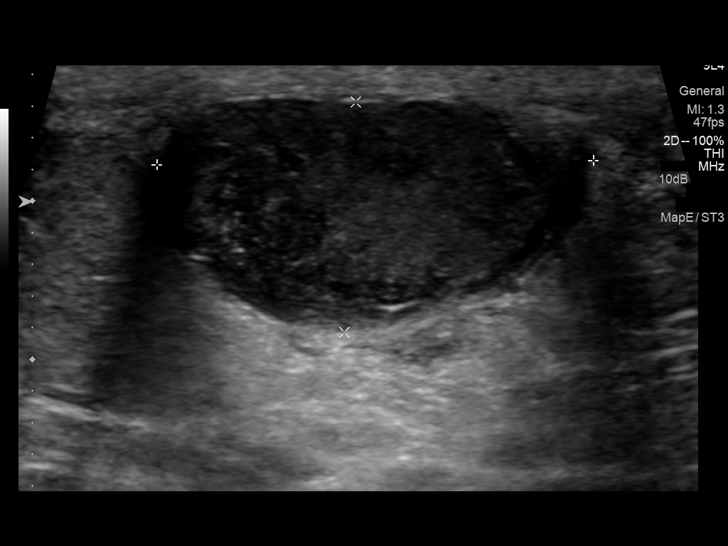
[im 9/12]
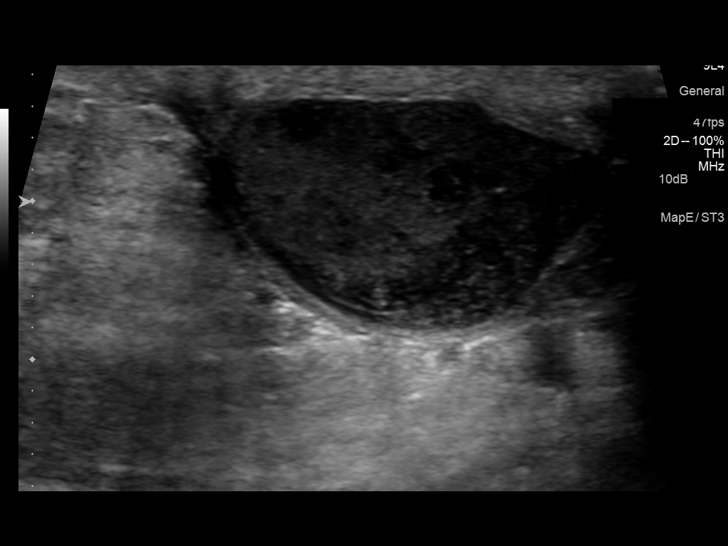
[im 10/12]
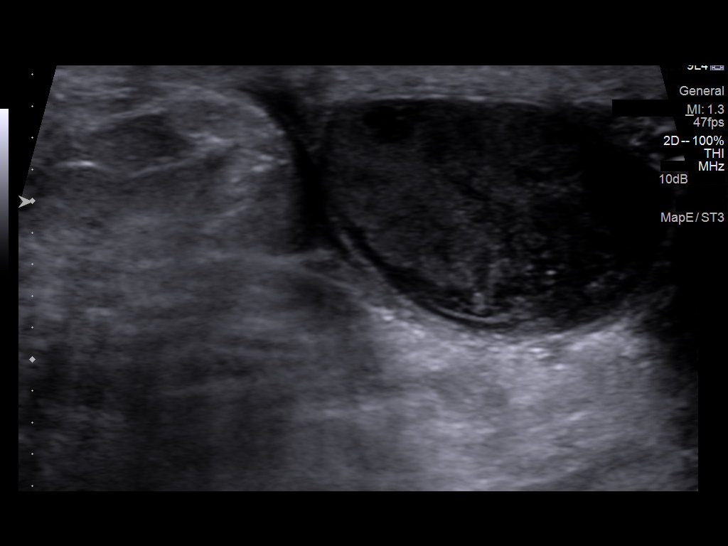
[im 11/12]
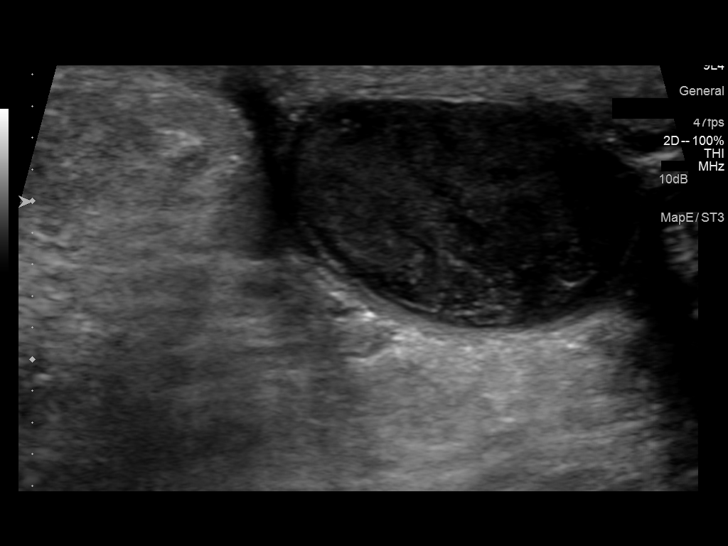
[im 12/12]
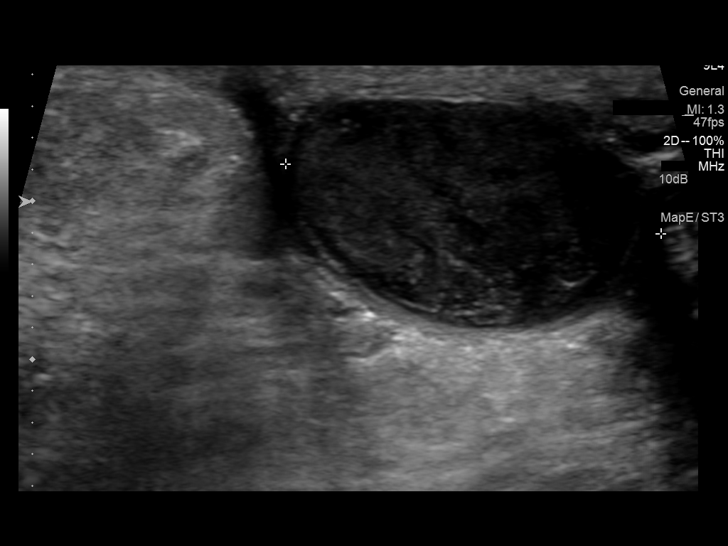

[12 of 12 positions shown; findings below may reference images not displayed]

FINDINGS: Ultrasound is performed, showing at the left breast superior area
lower area pain/redness is head oval hypoechoic lesion measuring
2.76 x 1.46 x 2.4 cm. Lesion has internal color flow. This lesion
has and appearance of fibroadenoma and less likely to represent
abscess. Note that this exam was scanned by ultrasound technologist
at the [REDACTED]
IMPRESSION: Probable benign findings. Oval hypoechoic lesion in the palpable
area more likely represent fibroadenoma and less likely abscess.

RECOMMENDATION:
Given the clinical symptoms of warmth, pain and tenderness, if the
clinical physician feels the patient has sign and symptoms of
mastitis, the patient should be treated for mastitis. The oval
hypoechoic lesion at the palpable area should be correlated with
mammographic findings on outpatient basis.

I have discussed the findings and recommendations with the patient.
Results were also provided in writing at the conclusion of the
visit. If applicable, a reminder letter will be sent to the patient
regarding the next appointment.

BI-RADS CATEGORY  3: Probably benign finding(s) - short interval
follow-up suggested.

## 2014-09-10 LAB — CULTURE, ROUTINE-ABSCESS: CULTURE: NO GROWTH

## 2014-09-11 LAB — ANAEROBIC CULTURE

## 2015-06-28 ENCOUNTER — Emergency Department (HOSPITAL_COMMUNITY)
Admission: EM | Admit: 2015-06-28 | Discharge: 2015-06-28 | Disposition: A | Discharge status: Home / Self Care | Payer: No Typology Code available for payment source | Attending: Emergency Medicine | Admitting: Emergency Medicine

## 2015-06-28 ENCOUNTER — Encounter (HOSPITAL_COMMUNITY): Payer: Self-pay | Admitting: Emergency Medicine

## 2015-06-28 DIAGNOSIS — N61 Mastitis without abscess: Secondary | ICD-10-CM | POA: Insufficient documentation

## 2015-06-28 DIAGNOSIS — Z8679 Personal history of other diseases of the circulatory system: Secondary | ICD-10-CM | POA: Insufficient documentation

## 2015-06-28 DIAGNOSIS — F17210 Nicotine dependence, cigarettes, uncomplicated: Secondary | ICD-10-CM | POA: Insufficient documentation

## 2015-06-28 MED ORDER — OXYCODONE-ACETAMINOPHEN 5-325 MG PO TABS
1.0000 | ORAL_TABLET | Freq: Once | ORAL | Status: AC
  Administered 2015-06-28: 1 via ORAL
  Filled 2015-06-28: qty 1

## 2015-06-28 MED ORDER — SULFAMETHOXAZOLE-TRIMETHOPRIM 800-160 MG PO TABS
1.0000 | ORAL_TABLET | Freq: Once | ORAL | Status: AC
  Administered 2015-06-28: 1 via ORAL
  Filled 2015-06-28: qty 1

## 2015-06-28 MED ORDER — LIDOCAINE-EPINEPHRINE (PF) 2 %-1:200000 IJ SOLN: 20.0000 mL | Freq: Once | INTRAMUSCULAR | Status: DC

## 2015-06-28 MED ORDER — LIDOCAINE HCL 1 % IJ SOLN
INTRAMUSCULAR | Status: AC
Start: 2015-06-28 — End: 2015-06-28
  Administered 2015-06-28: 11:00:00
  Filled 2015-06-28: qty 20

## 2015-06-28 MED ORDER — OXYCODONE-ACETAMINOPHEN 5-325 MG PO TABS: 1.0000 | ORAL_TABLET | ORAL | Status: DC | PRN

## 2015-06-28 MED ORDER — SULFAMETHOXAZOLE-TRIMETHOPRIM 800-160 MG PO TABS
1.0000 | ORAL_TABLET | Freq: Two times a day (BID) | ORAL | Status: AC
Start: 2015-06-28 — End: 2015-07-05

## 2015-06-28 MED ORDER — IBUPROFEN 200 MG PO TABS
600.0000 mg | ORAL_TABLET | Freq: Once | ORAL | Status: AC
  Administered 2015-06-28: 600 mg via ORAL
  Filled 2015-06-28: qty 3

## 2015-06-28 MED ORDER — IBUPROFEN 600 MG PO TABS: 600.0000 mg | ORAL_TABLET | Freq: Three times a day (TID) | ORAL | Status: DC | PRN

## 2015-06-28 NOTE — ED Provider Notes (Signed)
CSN: [id number]     Arrival date & time 06/28/15  0902 History   First MD Initiated Contact with Patient 06/28/15 [phone]     Chief Complaint  Patient presents with  . Abscess      HPI Patient presents to the emergency department with recent worsening of redness of her left breast just superior to her left nipple and tenderness of the left nipple.  She has a history of recurrent abscess to the left breast.  Last incision and drainage was performed in the operating room in June 2016.  She states that her symptoms been present over the past 4-5 days but it worsened over the past 24 hours.  No fevers or chills.  No drainage.  Pain is moderate to severe in severity and worse with palpation of her left breast.  Chaperone present throughout history and examination   Past Medical History  Diagnosis Date  . Abscess   . Rheumatic fever   . History of breast abscess 2013   Past Surgical History  Procedure Laterality Date  . Incise and drain abcess      left breast abscess  . Incision & drainage left chest      left chest  . Scar tissue removed    . Irrigation and debridement abscess Left 10/05/2013    Procedure: IRRIGATION AND DEBRIDEMENT left breast ABSCESS;  Surgeon: Merrie Roof, MD;  Location: [facility];  Service: General;  Laterality: Left;  . Incision and drainage of wound N/A 09/06/2014    Procedure: IRRIGATION AND DEBRIDEMENT LEFT BREAST ABCESS;  Surgeon: Jackolyn Confer, MD;  Location: [facility];  Service: General;  Laterality: N/A;  . Breast surgery     Family History  Problem Relation Age of Onset  . Heart disease Paternal Grandmother    Social History  Substance Use Topics  . Smoking status: Current Every Day Smoker -- 0.50 packs/day for 28 years    Types: Cigarettes  . Smokeless tobacco: None  . Alcohol Use: Yes     Comment: 1/2 bottle wine on weekends   OB History    No data available     Review of Systems  All other systems reviewed and are negative.     Allergies   Hydrocodone  Home Medications   Prior to Admission medications   Medication Sig Start Date End Date Taking? Authorizing Provider  none                      BP 139/91 mmHg  Pulse 93  Temp(Src) 98.1 F (36.7 C) (Oral)  Resp 14  SpO2 99%  LMP 06/24/2015 Physical Exam  Constitutional: She is oriented to person, place, and time. She appears well-developed and well-nourished.  HENT:  Head: Normocephalic.  Eyes: EOM are normal.  Neck: Normal range of motion.  Pulmonary/Chest: Effort normal.  Cellulitis of left breast involving the left nipple and superior to the left nipple.  Patient with surrounding induration.  No overt fluctuance.  No drainage.  Abdominal: She exhibits no distension.  Musculoskeletal: Normal range of motion.  Neurological: She is alert and oriented to person, place, and time.  Psychiatric: She has a normal mood and affect.  Nursing note and vitals reviewed.   ED Course  Procedures (including critical care time)  INCISION AND DRAINAGE Performed by: Hoy Morn Consent: Verbal consent obtained. Risks and benefits: risks, benefits and alternatives were discussed Time out performed prior to procedure Type: abscess Body area: left breast Anesthesia:  local infiltration Incision was made with a scalpel. Local anesthetic: lidocaine 1% without epinephrine Anesthetic total: 6 ml Complexity: simple Drainage: none Drainage amount: none Packing material: none Patient tolerance: Patient tolerated the procedure well with no immediate complications.    EMERGENCY DEPARTMENT US SOFT TISSUE INTERPRETATION "Study: Limited Ultrasound of the noted body part in comments below" INDICATIONS: Soft tissue infection Multiple views of the body part are obtained with a multi-frequency linear probe PERFORMED BY:  Myself IMAGES ARCHIVED?: Yes SIDE:Left BODY PART:Breast FINDINGS: No abcess noted and Cellulitis present LIMITATIONS:  none INTERPRETATION:  No abcess  noted and Cellulitis present COMMENT:        Labs Review Labs Reviewed - No data to display  Imaging Review No results found. I have personally reviewed and evaluated these images and lab results as part of my medical decision-making.   EKG Interpretation None      MDM   Final diagnoses:  Cellulitis of left breast    No clear evidence of abscess present on bedside ultrasound.  Despite this incision and drainage was performed as many times are still a small amount of pus encountered despite an ultrasound demonstrating no obvious fluid collection.  In this case however no clear abscess was encountered.  Patient be started on antibiotics and warm compresses.  General surgery follow-up.  She understands to return to the ER for new or worsening symptoms.  She has a history of chronic recurrent abscess/cellulitis/mastitis.  Chaperone present throughout history, examination, procedures    Jola Schmidt, MD 06/28/15 1001

## 2015-06-28 NOTE — ED Notes (Signed)
Per pt, states left breast abscess-had surgery for same a year ago

## 2015-06-28 NOTE — ED Notes (Signed)
Pt reports L breast pain and abscess since the weekend.  Redness noted.  Pt reports having a surgery done in same breast last year in June by General Surgery and had a tube placed for drainage.  No drainage noted at this time

## 2016-03-01 ENCOUNTER — Emergency Department (HOSPITAL_COMMUNITY)
Admission: EM | Admit: 2016-03-01 | Discharge: 2016-03-01 | Disposition: A | Discharge status: Home / Self Care | Payer: No Typology Code available for payment source | Attending: Emergency Medicine | Admitting: Emergency Medicine

## 2016-03-01 ENCOUNTER — Encounter (HOSPITAL_COMMUNITY): Payer: Self-pay | Admitting: Emergency Medicine

## 2016-03-01 ENCOUNTER — Emergency Department (HOSPITAL_COMMUNITY): Payer: No Typology Code available for payment source

## 2016-03-01 DIAGNOSIS — Y999 Unspecified external cause status: Secondary | ICD-10-CM | POA: Insufficient documentation

## 2016-03-01 DIAGNOSIS — Y929 Unspecified place or not applicable: Secondary | ICD-10-CM | POA: Insufficient documentation

## 2016-03-01 DIAGNOSIS — Z79899 Other long term (current) drug therapy: Secondary | ICD-10-CM | POA: Insufficient documentation

## 2016-03-01 DIAGNOSIS — F17210 Nicotine dependence, cigarettes, uncomplicated: Secondary | ICD-10-CM | POA: Insufficient documentation

## 2016-03-01 DIAGNOSIS — S6991XA Unspecified injury of right wrist, hand and finger(s), initial encounter: Secondary | ICD-10-CM | POA: Insufficient documentation

## 2016-03-01 DIAGNOSIS — Y939 Activity, unspecified: Secondary | ICD-10-CM | POA: Insufficient documentation

## 2016-03-01 MED ORDER — IBUPROFEN 600 MG PO TABS: 600.0000 mg | ORAL_TABLET | Freq: Four times a day (QID) | ORAL | 0 refills | Status: DC | PRN

## 2016-03-01 MED ORDER — IBUPROFEN 200 MG PO TABS
600.0000 mg | ORAL_TABLET | Freq: Once | ORAL | Status: AC
  Administered 2016-03-01: 600 mg via ORAL
  Filled 2016-03-01: qty 3
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
## 2016-07-02 ENCOUNTER — Encounter (HOSPITAL_COMMUNITY): Payer: Self-pay | Admitting: Emergency Medicine

## 2016-07-02 ENCOUNTER — Emergency Department (HOSPITAL_COMMUNITY)
Admission: EM | Admit: 2016-07-02 | Discharge: 2016-07-02 | Disposition: A | Discharge status: Home / Self Care | Payer: No Typology Code available for payment source | Attending: Emergency Medicine | Admitting: Emergency Medicine

## 2016-07-02 DIAGNOSIS — Z23 Encounter for immunization: Secondary | ICD-10-CM | POA: Insufficient documentation

## 2016-07-02 DIAGNOSIS — F17210 Nicotine dependence, cigarettes, uncomplicated: Secondary | ICD-10-CM | POA: Insufficient documentation

## 2016-07-02 DIAGNOSIS — Z79899 Other long term (current) drug therapy: Secondary | ICD-10-CM | POA: Insufficient documentation

## 2016-07-02 DIAGNOSIS — L729 Follicular cyst of the skin and subcutaneous tissue, unspecified: Secondary | ICD-10-CM

## 2016-07-02 DIAGNOSIS — L02412 Cutaneous abscess of left axilla: Secondary | ICD-10-CM | POA: Insufficient documentation

## 2016-07-02 DIAGNOSIS — L089 Local infection of the skin and subcutaneous tissue, unspecified: Secondary | ICD-10-CM | POA: Insufficient documentation

## 2016-07-02 MED ORDER — TETANUS-DIPHTH-ACELL PERTUSSIS 5-2.5-18.5 LF-MCG/0.5 IM SUSP
0.5000 mL | Freq: Once | INTRAMUSCULAR | Status: AC
  Administered 2016-07-02: 0.5 mL via INTRAMUSCULAR
  Filled 2016-07-02: qty 0.5

## 2016-07-02 MED ORDER — CEPHALEXIN 500 MG PO CAPS: 500.0000 mg | ORAL_CAPSULE | Freq: Four times a day (QID) | ORAL | 0 refills | Status: AC

## 2016-07-02 MED ORDER — OXYCODONE-ACETAMINOPHEN 5-325 MG PO TABS: 1.0000 | ORAL_TABLET | ORAL | 0 refills | Status: AC | PRN

## 2016-07-02 MED ORDER — OXYCODONE-ACETAMINOPHEN 5-325 MG PO TABS
1.0000 | ORAL_TABLET | Freq: Once | ORAL | Status: AC
  Administered 2016-07-02: 1 via ORAL
  Filled 2016-07-02: qty 1

## 2016-07-02 MED ORDER — LIDOCAINE-EPINEPHRINE (PF) 2 %-1:200000 IJ SOLN
10.0000 mL | Freq: Once | INTRAMUSCULAR | Status: AC
  Administered 2016-07-02: 10 mL
  Filled 2016-07-02: qty 20

## 2016-07-02 MED FILL — CEPHALEXIN 500 MG CAPSULE: 500 | 6 days supply | Qty: 24 | Fill #0

## 2016-07-02 MED FILL — OXYCODONE-ACETAMINOPHEN 5-3: 5-325 | 2 days supply | Qty: 15 | Fill #0

## 2016-07-02 NOTE — ED Notes (Signed)
Bed: WTR7 Expected date:  Expected time:  Means of arrival:  Comments: 

## 2016-07-02 NOTE — ED Provider Notes (Signed)
[facility] DEPT Provider Note   CSN: [id number] Arrival date & time: 07/02/16  0945   By signing my name below, I, [name], attest that this documentation has been prepared under the direction and in the presence of Wyn Quaker, PA-C Electronically Signed: Soijett Johnson, ED Scribe. 07/02/16. 12:18 PM.  History   Chief Complaint Chief Complaint  Patient presents with  . Abscess    HPI [name] is a 46 y.o. female with a PMHx of abscess, who presents to the Emergency Department complaining of gradually worsening, abscess to left axilla onset 3 days ago. She notes that she has a prior hx of abscesses that have required incision and drainage. Pt reports associated redness to the affected area. Pt has tried warm compresses and 600 mg ibuprofen with no relief of her symptoms. Denies shaving recently prior to the onset of her symptoms. She denies fever, diaphoresis, chills, drainage, nausea, vomiting, and any other symptoms. Denies allergies to medications.    The history is provided by the patient. No language interpreter was used.    Past Medical History:  Diagnosis Date  . Abscess   . History of breast abscess 2013  . Rheumatic fever     Patient Active Problem List   Diagnosis Date Noted  . Mastitis 10/02/2013  . Left breast abscess 04/12/2011    Past Surgical History:  Procedure Laterality Date  . BREAST SURGERY    . INCISE AND DRAIN ABCESS     left breast abscess  . Incision & drainage left chest     left chest  . INCISION AND DRAINAGE OF WOUND N/A 09/06/2014   Procedure: IRRIGATION AND DEBRIDEMENT LEFT BREAST ABCESS;  Surgeon: Jackolyn Confer, MD;  Location: [facility];  Service: General;  Laterality: N/A;  . IRRIGATION AND DEBRIDEMENT ABSCESS Left 10/05/2013   Procedure: IRRIGATION AND DEBRIDEMENT left breast ABSCESS;  Surgeon: Merrie Roof, MD;  Location: [facility];  Service: General;  Laterality: Left;  . scar tissue removed      OB History    No  data available       Home Medications    Prior to Admission medications   Medication Sig Start Date End Date Taking? Authorizing Provider  cephALEXin (KEFLEX) 500 MG capsule Take 1 capsule (500 mg total) by mouth 4 (four) times daily. 07/02/16 07/08/16  Lorin Glass, PA-C  ibuprofen (ADVIL,MOTRIN) 600 MG tablet Take 1 tablet (600 mg total) by mouth every 6 (six) hours as needed. 03/01/16   Emeline General, PA-C  oxyCODONE-acetaminophen (PERCOCET/ROXICET) 5-325 MG tablet Take 1-2 tablets by mouth every 4 (four) hours as needed for severe pain. 07/02/16 07/05/16  Lorin Glass, PA-C    Family History Family History  Problem Relation Age of Onset  . Heart disease Paternal Grandmother     Social History Social History  Substance Use Topics  . Smoking status: Current Every Day Smoker    Packs/day: 0.50    Years: 28.00    Types: Cigarettes  . Smokeless tobacco: Never Used  . Alcohol use Yes     Comment: 1/2 bottle wine on weekends     Allergies   Hydrocodone   Review of Systems Review of Systems  Constitutional: Negative for chills, diaphoresis and fever.  Gastrointestinal: Negative for nausea.  Skin: Positive for color change (redness to the affected area).       +Abscess to left axilla without drainage     Physical Exam Updated Vital Signs BP (!) 145/94 (  BP Location: Left Arm)   Pulse 78   Temp 97.8 F (36.6 C) (Oral)   Resp 15   SpO2 100%   Physical Exam  Constitutional: She is oriented to person, place, and time. She appears well-developed and well-nourished. No distress.  HENT:  Head: Normocephalic and atraumatic.  Eyes: EOM are normal.  Neck: Neck supple.  Cardiovascular: Normal rate.   Pulmonary/Chest: Effort normal. No respiratory distress.  Abdominal: She exhibits no distension.  Musculoskeletal: Normal range of motion.  Neurological: She is alert and oriented to person, place, and time.  Skin: Skin is warm and dry. There is erythema.    3 x 2 cm swelling to left axilla. Area is red, fluctuance and extremely tender to touch. There is no signs of surrounding cellulitis. 1 x 1 cm mass noted that is red, fluctuant, and tender bout 3 cm inferior to larger area.   Psychiatric: She has a normal mood and affect. Her behavior is normal.  Nursing note and vitals reviewed.    ED Treatments / Results  DIAGNOSTIC STUDIES: Oxygen Saturation is 100% on RA, nl by my interpretation.    COORDINATION OF CARE: 10:58 AM Discussed treatment plan with pt at bedside which includes I&D, update tetanus vaccination, referral and follow up with surgery, and pt agreed to plan.   Procedures .Marland KitchenIncision and Drainage Date/Time: 07/02/2016 11:49 AM Performed by: Lorin Glass Authorized by: Lorin Glass   Consent:    Consent obtained:  Verbal   Consent given by:  Patient   Risks discussed:  Pain and infection Location:    Type:  Abscess   Size:  3 x 2 cm   Location:  Upper extremity   Upper extremity location: left axilla. Pre-procedure details:    Skin preparation:  Betadine Anesthesia (see MAR for exact dosages):    Anesthesia method:  Local infiltration   Local anesthetic:  Lidocaine 2% WITH epi (7 ml used) Procedure type:    Complexity:  Complex Procedure details:    Needle aspiration: no     Incision types:  Stab incision   Incision depth:  Dermal   Scalpel blade:  11   Wound management:  Probed and deloculated and irrigated with saline   Drainage:  Purulent   Drainage amount:  Moderate   Wound treatment:  Wound left open   Packing material: 1 in iodoform gauze with 1 inch packing placed. Post-procedure details:    Patient tolerance of procedure:  Tolerated well, no immediate complications .Marland KitchenIncision and Drainage Date/Time: 07/02/2016 12:13 PM Performed by: Lorin Glass Authorized by: Lorin Glass   Consent:    Consent obtained:  Verbal   Consent given by:  Patient   Risks discussed:  Pain,  infection, incomplete drainage, damage to other organs and bleeding   Alternatives discussed:  No treatment, delayed treatment, referral and observation Location:    Type:  Cyst (infected)   Size:  1 x 1 cm   Location:  Upper extremity Pre-procedure details:    Skin preparation:  Betadine Anesthesia (see MAR for exact dosages):    Anesthesia method:  Local infiltration   Local anesthetic:  Lidocaine 2% WITH epi (3 ml used) Procedure type:    Complexity:  Complex Procedure details:    Needle aspiration: no     Incision types:  Stab incision   Incision depth:  Dermal   Scalpel blade:  11   Wound management:  Probed and deloculated and irrigated with saline   Drainage:  Purulent   Drainage amount:  Moderate   Wound treatment:  Wound left open   Packing material: 1 in iodoform gauze with 3 inches packing placed. Post-procedure details:    Patient tolerance of procedure:  Tolerated well, no immediate complications   (including critical care time)  Medications Ordered in ED Medications  lidocaine-EPINEPHrine (XYLOCAINE W/EPI) 2 %-1:200000 (PF) injection 10 mL (10 mLs Infiltration Given 07/02/16 1127)  oxyCODONE-acetaminophen (PERCOCET/ROXICET) 5-325 MG per tablet 1 tablet (1 tablet Oral Given 07/02/16 1124)  Tdap (BOOSTRIX) injection 0.5 mL (0.5 mLs Intramuscular Given 07/02/16 1125)     Initial Impression / Assessment and Plan / ED Course  I have reviewed the triage vital signs and the nursing notes.  Consistent with the STOP act, the [place] was queried for the patient based on the information and address listed in the medical record.  No prescriptions were found in the past 12 months.      Patient with skin abscesses, no signs of systemic infection. Incision and drainage performed in the ED today.  Abscess was large enough to warrant packing. Wound recheck in 2 days. Supportive care and return precautions discussed. Patient was given oxycodone, states that hydrocodone causes her to  itch but she has taken oxycodone before with out problem. Pt sent home with oxycodone and keflex prescriptions. While there was not obvious cellulitis, the larger abscess was loculated and scar tissue was present from previous abscesses.  Will refer the pt to general surgery for further evaluation as this is a recurrent problem, and she has multiple non infected cysts in the general area.  The patient appears reasonably screened and/or stabilized for discharge and I doubt any other emergent medical condition requiring further screening, evaluation, or treatment in the ED prior to discharge.   Final Clinical Impressions(s) / ED Diagnoses   Final diagnoses:  Abscess of left axilla  Infected cyst of skin    New Prescriptions Discharge Medication List as of 07/02/2016 12:34 PM    START taking these medications   Details  cephALEXin (KEFLEX) 500 MG capsule Take 1 capsule (500 mg total) by mouth 4 (four) times daily., Starting Mon 07/02/2016, Until Sun 07/08/2016, Print       I personally performed the services described in this documentation, which was scribed in my presence. The recorded information has been reviewed and is accurate.     Lorin Glass, PA-C 07/04/16 1130    Milton Ferguson, MD 07/10/16 2145

## 2016-07-02 NOTE — ED Triage Notes (Addendum)
Pt reports abscess under L axilla for the past 2 weeks.

## 2016-08-29 ENCOUNTER — Emergency Department (HOSPITAL_COMMUNITY)
Admission: EM | Admit: 2016-08-29 | Discharge: 2016-08-29 | Disposition: A | Discharge status: Home / Self Care | Payer: No Typology Code available for payment source | Attending: Emergency Medicine | Admitting: Emergency Medicine

## 2016-08-29 ENCOUNTER — Encounter (HOSPITAL_COMMUNITY): Payer: Self-pay | Admitting: Emergency Medicine

## 2016-08-29 DIAGNOSIS — Z7982 Long term (current) use of aspirin: Secondary | ICD-10-CM | POA: Insufficient documentation

## 2016-08-29 DIAGNOSIS — F17210 Nicotine dependence, cigarettes, uncomplicated: Secondary | ICD-10-CM | POA: Insufficient documentation

## 2016-08-29 DIAGNOSIS — L723 Sebaceous cyst: Secondary | ICD-10-CM | POA: Insufficient documentation

## 2016-08-29 DIAGNOSIS — L089 Local infection of the skin and subcutaneous tissue, unspecified: Secondary | ICD-10-CM

## 2016-08-29 MED ORDER — CEPHALEXIN 500 MG PO CAPS: ORAL_CAPSULE | ORAL | 0 refills | Status: DC

## 2016-08-29 MED ORDER — NAPROXEN 500 MG PO TABS: 500.0000 mg | ORAL_TABLET | Freq: Two times a day (BID) | ORAL | 0 refills | Status: DC | PRN

## 2016-08-29 MED ORDER — OXYCODONE-ACETAMINOPHEN 5-325 MG PO TABS: 1.0000 | ORAL_TABLET | Freq: Four times a day (QID) | ORAL | 0 refills | Status: DC | PRN

## 2016-08-29 MED ORDER — OXYCODONE-ACETAMINOPHEN 5-325 MG PO TABS
1.0000 | ORAL_TABLET | Freq: Once | ORAL | Status: AC
  Administered 2016-08-29: 1 via ORAL

## 2016-08-29 MED ORDER — LIDOCAINE-EPINEPHRINE (PF) 2 %-1:200000 IJ SOLN
INTRAMUSCULAR | Status: AC
  Filled 2016-08-29: qty 20

## 2016-08-29 MED ORDER — LIDOCAINE-EPINEPHRINE (PF) 2 %-1:200000 IJ SOLN
10.0000 mL | Freq: Once | INTRAMUSCULAR | Status: AC
  Administered 2016-08-29: 10 mL
  Filled 2016-08-29: qty 20

## 2016-08-29 NOTE — ED Triage Notes (Signed)
Pt reports that she "bumped" her head 2 weeks ago. Redness and swelling noted on r/side of forehead. Pain and swelling increased to  A bright red 2-3 cm abscess.

## 2016-08-29 NOTE — ED Provider Notes (Signed)
[facility] DEPT Provider Note   CSN: [id number] Arrival date & time: 08/29/16  1232  By signing my name below, I, [name], attest that this documentation has been prepared under the direction and in the presence of [employer].  Electronically Signed: Ephriam Johnson, ED Scribe. 08/29/16. 12:49 PM.  History   Chief Complaint Chief Complaint  Patient presents with  . Abscess    HPI HPI Comments: [name] is a 46 y.o. female, with a PMHx of abscesses, who presents to the Emergency Department complaining of a gradually worsening, painful area of erythema, induration, and swelling to the right side of her forehead, onset one week ago. Two weeks ago, pt struck the right side of her forehead while getting into a car; she states that she had previously noticed a small "cyst" in that area, and that's the exact area she struck. It swelled slightly but then went down, until about one week following the incident she noticed it began to swell more, and became more painful and red. She describes the pain as 9/10 constant, throbbing, burning, nonradiating R forehead pain, worsened upon palpation, unrelieved by ibuprofen and hot compresses.  She denies warmth to the area, drainage, red streaking, vision changes, fevers, chills, CP, SOB, abd pain, N/V/D/C, hematuria, dysuria, myalgias, arthralgias, numbness, tingling, focal weakness, or any other complaints at this time.  The history is provided by the patient and medical records. No language interpreter was used.  Abscess  Location:  Head/neck Head/neck abscess location:  Head (right side of forehead) Abscess quality: induration, painful and redness   Abscess quality: not draining and no warmth   Red streaking: no   Duration:  1 week Progression:  Worsening Pain details:    Quality:  Throbbing and burning   Severity:  Severe (9/10)   Duration:  1 week   Timing:  Constant   Progression:  Worsening Chronicity:   Recurrent Context: not diabetes, not immunosuppression and not skin injury   Relieved by:  Nothing Exacerbated by: palpation. Ineffective treatments:  NSAIDs and warm compresses Associated symptoms: no fever, no nausea and no vomiting   Risk factors: prior abscess     Past Medical History:  Diagnosis Date  . Abscess   . History of breast abscess 2013  . Rheumatic fever    Patient Active Problem List   Diagnosis Date Noted  . Mastitis 10/02/2013  . Left breast abscess 04/12/2011   Past Surgical History:  Procedure Laterality Date  . BREAST SURGERY    . INCISE AND DRAIN ABCESS     left breast abscess  . Incision & drainage left chest     left chest  . INCISION AND DRAINAGE OF WOUND N/A 09/06/2014   Procedure: IRRIGATION AND DEBRIDEMENT LEFT BREAST ABCESS;  Surgeon: Jackolyn Confer, MD;  Location: [facility];  Service: General;  Laterality: N/A;  . IRRIGATION AND DEBRIDEMENT ABSCESS Left 10/05/2013   Procedure: IRRIGATION AND DEBRIDEMENT left breast ABSCESS;  Surgeon: Merrie Roof, MD;  Location: [facility];  Service: General;  Laterality: Left;  . scar tissue removed      OB History    No data available     Home Medications    Prior to Admission medications   Medication Sig Start Date End Date Taking? Authorizing Provider  ibuprofen (ADVIL,MOTRIN) 600 MG tablet Take 1 tablet (600 mg total) by mouth every 6 (six) hours as needed. 03/01/16   Emeline General PA-C   Family History Family History  Problem Relation Age of Onset  . Heart disease Paternal Grandmother     Social History Social History  Substance Use Topics  . Smoking status: Current Every Day Smoker    Packs/day: 0.50    Years: 28.00    Types: Cigarettes  . Smokeless tobacco: Never Used  . Alcohol use Yes     Comment: 1/2 bottle wine on weekends   Allergies   Hydrocodone   Review of Systems Review of Systems  Constitutional: Negative for chills and fever.  Eyes: Negative for visual disturbance.   Respiratory: Negative for shortness of breath.   Cardiovascular: Negative for chest pain.  Gastrointestinal: Negative for abdominal pain, constipation, diarrhea, nausea and vomiting.  Genitourinary: Negative for dysuria and hematuria.  Musculoskeletal: Negative for arthralgias and myalgias.  Skin: Positive for color change (redness) and wound (right forehead abscess).  Allergic/Immunologic: Negative for immunocompromised state.  Neurological: Negative for weakness and numbness.  Psychiatric/Behavioral: Negative for confusion.  All systems reviewed and all are negative for acute change except as noted in the HPI.  Physical Exam Updated Vital Signs BP 109/79   Pulse 84   Temp 98.3 F (36.8 C) (Oral)   Resp 16   Ht 5\' 6"  (1.676 m)   Wt 170 lb (77.1 kg)   SpO2 99%   BMI 27.44 kg/m   Physical Exam  Constitutional: She is oriented to person, place, and time. Vital signs are normal. She appears well-developed and well-nourished.  Non-toxic appearance. No distress.  Afebrile, nontoxic, NAD  HENT:  Head: Normocephalic and atraumatic.  Mouth/Throat: Mucous membranes are normal.  Eyes: Conjunctivae and EOM are normal. Right eye exhibits no discharge. Left eye exhibits no discharge.  Neck: Normal range of motion. Neck supple.  Cardiovascular: Normal rate and intact distal pulses.   Pulmonary/Chest: Effort normal. No respiratory distress.  Abdominal: Normal appearance. She exhibits no distension.  Musculoskeletal: Normal range of motion.  Neurological: She is alert and oriented to person, place, and time. She has normal strength. No sensory deficit.  Skin: Skin is warm, dry and intact. No rash noted. There is erythema.  ~3cm fluctuant abscess/cyst to the right forehead, erythematous and warm to the touch, without any red streaking or surrounding cellulitis. SEE PICTURE BELOW.  Psychiatric: She has a normal mood and affect. Her behavior is normal.  Nursing note and vitals  reviewed.     ED Treatments / Results  DIAGNOSTIC STUDIES: Oxygen Saturation is 99% on RA, normal by my interpretation.  COORDINATION OF CARE: 1:12 PM-Discussed treatment plan with pt at bedside and pt agreed to plan.   Labs (all labs ordered are listed, but only abnormal results are displayed) Labs Reviewed - No data to display  EKG  EKG Interpretation None       Radiology No results found.  Procedures .Marland KitchenIncision and Drainage Date/Time: 08/29/2016 1:15 PM Performed by: Reece Agar Authorized by: Reece Agar   Consent:    Consent obtained:  Verbal   Consent given by:  Patient   Risks discussed:  Incomplete drainage and pain   Alternatives discussed:  No treatment and alternative treatment Location:    Type:  Cyst   Location:  Head   Head location:  Face (right side of forehead) Pre-procedure details:    Skin preparation:  Betadine Anesthesia (see MAR for exact dosages):    Anesthesia method:  Local infiltration   Local anesthetic:  Lidocaine 2% WITH epi Procedure type:    Complexity:  Simple Procedure details:  Needle aspiration: yes     Needle size:  18 G   Incision types:  Single straight   Incision depth:  Subcutaneous   Scalpel blade:  11   Wound management:  Probed and deloculated   Drainage:  Purulent   Drainage amount:  Scant   Wound treatment:  Wound left open   Packing materials:  None Post-procedure details:    Patient tolerance of procedure:  Tolerated well, no immediate complications  Fine needle aspiration Date/Time: 08/29/2016 1:28 PM Performed by: Reece Agar Authorized by: Reece Agar  Consent: Verbal consent obtained. Written consent obtained. Risks and benefits: risks, benefits and alternatives were discussed Consent given by: patient Patient understanding: patient states understanding of the procedure being performed Patient consent: the patient's understanding of the procedure matches consent given Patient  identity confirmed: verbally with patient Preparation: Patient was prepped and draped in the usual sterile fashion. Local anesthesia used: yes Anesthesia method: topical PainEase Spray.  Anesthesia: Local anesthesia used: yes Local Anesthetic: topical anesthetic  Sedation: Patient sedated: no Comments: Attempted needle aspiration of infected sebaceous cyst to R forehead, using PainEase spray and 18G needle, however pt was unable to tolerate full procedure, only drained ~65% of cyst material before she requested I stop; converted to formal I&D using lidocaine with epi for better pain control, and needle incision was made slightly bigger for improved drainage; see other procedure note for details of that procedure    (including critical care time)  Medications Ordered in ED Medications  oxyCODONE-acetaminophen (PERCOCET/ROXICET) 5-325 MG per tablet 1 tablet (not administered)  lidocaine-EPINEPHrine (XYLOCAINE W/EPI) 2 %-1:200000 (PF) injection 10 mL (not administered)     Initial Impression / Assessment and Plan / ED Course  I have reviewed the triage vital signs and the nursing notes.  Pertinent labs & imaging results that were available during my care of the patient were reviewed by me and considered in my medical decision making (see chart for details).     46 y.o. female here with what appears to be an infected sebaceous cyst on R forehead. No surrounding cellulitis. Discussed option of needle aspiration in order to limit scarring, and have less painful procedure since no additional needle sticks were necessary (since no lidocaine was to be used) and she initially accepted; used pain ease spray and inserted 18G needle to aspirate, when I was approx 65% done she insisted I stop due to the pain. I then further anesthetized her, and used an 11 blade to make the prior needle mark just slightly larger; advised that it may scar more, but pt was content with how it was performed. Left as  small of an incision as possible, drained the remainder of the infected sebaceous material. Discussed warm compresses, will send home with pain meds and abx, advised f/up with [facility] in 2-3 days for wound check, and with [facility] in 1wk for recheck and to establish care. [facility] reviewed prior to dispensing controlled substance medications, and 1 year search was notable for: one rx from 07/02/16 for 15 tabs of percocet. Risks/benefits/alternatives and expectations discussed regarding controlled substances. Side effects of medications discussed. Informed consent obtained.   I explained the diagnosis and have given explicit precautions to return to the ER including for any other new or worsening symptoms. The patient understands and accepts the medical plan as it's been dictated and I have answered their questions. Discharge instructions concerning home care and prescriptions have been given. The patient is STABLE and is discharged to  home in good condition.   I personally performed the services described in this documentation, which was scribed in my presence. The recorded information has been reviewed and is accurate.   Final Clinical Impressions(s) / ED Diagnoses   Final diagnoses:  Infected sebaceous cyst    New Prescriptions New Prescriptions   CEPHALEXIN (KEFLEX) 500 MG CAPSULE    2 caps po bid x 7 days   NAPROXEN (NAPROSYN) 500 MG TABLET    Take 1 tablet (500 mg total) by mouth 2 (two) times daily as needed for mild pain, moderate pain or headache (TAKE WITH MEALS.).   OXYCODONE-ACETAMINOPHEN (PERCOCET) 5-325 MG TABLET    Take 1 tablet by mouth every 6 (six) hours as needed for severe pain.     [address] [place], [place] 08/29/16 1337    Charlesetta Shanks, MD 08/30/16 [phone]

## 2016-08-29 NOTE — Discharge Instructions (Signed)
Keep wound clean and dry. Apply warm compresses to affected area throughout the day. Take antibiotic until it is finished. Take naprosyn and percocet as directed, as needed for pain but do not drive or operate machinery with pain medication use. Followup with [name] in 2-3 days for wound recheck and then with the [facility] in 1 week for recheck and to establish medical care. Monitor area for signs of infection to include, but not limited to: increasing pain, spreading redness, drainage/pus, worsening swelling, or fevers. Return to emergency department for emergent changing or worsening symptoms.

## 2016-08-29 NOTE — ED Notes (Signed)
Bed: [facility] Expected date:  Expected time:  Means of arrival:  Comments: 

## 2016-09-10 ENCOUNTER — Emergency Department (HOSPITAL_COMMUNITY)
Admission: EM | Admit: 2016-09-10 | Discharge: 2016-09-10 | Disposition: A | Discharge status: Home / Self Care | Payer: No Typology Code available for payment source | Attending: Emergency Medicine | Admitting: Emergency Medicine

## 2016-09-10 ENCOUNTER — Encounter (HOSPITAL_COMMUNITY): Payer: Self-pay | Admitting: *Deleted

## 2016-09-10 DIAGNOSIS — L0291 Cutaneous abscess, unspecified: Secondary | ICD-10-CM | POA: Insufficient documentation

## 2016-09-10 DIAGNOSIS — F17210 Nicotine dependence, cigarettes, uncomplicated: Secondary | ICD-10-CM | POA: Insufficient documentation

## 2016-09-10 MED ORDER — MORPHINE SULFATE (PF) 10 MG/ML IV SOLN
10.0000 mg | Freq: Once | INTRAVENOUS | Status: AC
  Administered 2016-09-10: 10 mg via INTRAMUSCULAR
  Filled 2016-09-10: qty 1

## 2016-09-10 MED ORDER — DOXYCYCLINE HYCLATE 100 MG PO CAPS: 100.0000 mg | ORAL_CAPSULE | Freq: Two times a day (BID) | ORAL | 0 refills | Status: DC

## 2016-09-10 MED ORDER — ONDANSETRON 4 MG PO TBDP
4.0000 mg | ORAL_TABLET | Freq: Once | ORAL | Status: AC
  Administered 2016-09-10: 4 mg via ORAL
  Filled 2016-09-10: qty 1

## 2016-09-10 MED ORDER — LIDOCAINE-EPINEPHRINE 1 %-1:100000 IJ SOLN
10.0000 mL | Freq: Once | INTRAMUSCULAR | Status: DC
  Filled 2016-09-10: qty 10

## 2016-09-10 MED ORDER — LIDOCAINE-EPINEPHRINE (PF) 2 %-1:200000 IJ SOLN
INTRAMUSCULAR | Status: AC
  Administered 2016-09-10: 20 mL
  Filled 2016-09-10: qty 20

## 2016-09-10 MED ORDER — OXYCODONE-ACETAMINOPHEN 5-325 MG PO TABS: 2.0000 | ORAL_TABLET | ORAL | 0 refills | Status: DC | PRN

## 2016-09-10 NOTE — ED Triage Notes (Signed)
Patient is alert and oriented x4.  She is complaining of a vaginal abscess that started 4 days ago.  Patient states states that it is red and swollen with no drainage.  Currently she rates the pain 10 of 10.

## 2016-09-10 NOTE — ED Notes (Signed)
Bed: [facility] Expected date:  Expected time:  Means of arrival:  Comments: 

## 2016-09-10 NOTE — ED Provider Notes (Signed)
[facility] DEPT Provider Note   CSN: [id number] Arrival date & time: 09/10/16  1248     History   Chief Complaint Chief Complaint  Patient presents with  . Abscess    labia    HPI [name] is a 46 y.o. female. Chief complaint is labial pain and swelling.  HPI: 46 year old female. She has a history of multiple previous cutaneous abscesses. Most recently had I and D of a right forehead abscess. Presents today with pain and swelling of her right labia for the last 3-4 days without drainage. She's had I and D of previous subcutaneous abscess in the site when he years ago. No vaginal discharge or other symptoms.  Past Medical History:  Diagnosis Date  . Abscess   . History of breast abscess 2013  . Rheumatic fever     Patient Active Problem List   Diagnosis Date Noted  . Mastitis 10/02/2013  . Left breast abscess 04/12/2011    Past Surgical History:  Procedure Laterality Date  . BREAST SURGERY    . INCISE AND DRAIN ABCESS     left breast abscess  . Incision & drainage left chest     left chest  . INCISION AND DRAINAGE OF WOUND N/A 09/06/2014   Procedure: IRRIGATION AND DEBRIDEMENT LEFT BREAST ABCESS;  Surgeon: Jackolyn Confer, MD;  Location: [facility];  Service: General;  Laterality: N/A;  . IRRIGATION AND DEBRIDEMENT ABSCESS Left 10/05/2013   Procedure: IRRIGATION AND DEBRIDEMENT left breast ABSCESS;  Surgeon: Merrie Roof, MD;  Location: [facility];  Service: General;  Laterality: Left;  . scar tissue removed      OB History    No data available       Home Medications    Prior to Admission medications   Medication Sig Start Date End Date Taking? Authorizing Provider  ibuprofen (ADVIL,MOTRIN) 200 MG tablet Take 200 mg by mouth every 6 (six) hours as needed for moderate pain.   Yes [provider]  cephALEXin (KEFLEX) 500 MG capsule 2 caps po bid x 7 days Patient not taking: Reported on 09/10/2016 08/29/16   Street, Day, PA-C  doxycycline  (VIBRAMYCIN) 100 MG capsule Take 1 capsule (100 mg total) by mouth 2 (two) times daily. 09/10/16   Tanna Furry, MD  ibuprofen (ADVIL,MOTRIN) 600 MG tablet Take 1 tablet (600 mg total) by mouth every 6 (six) hours as needed. Patient not taking: Reported on 09/10/2016 03/01/16   Avie Echevaria B, PA-C  naproxen (NAPROSYN) 500 MG tablet Take 1 tablet (500 mg total) by mouth 2 (two) times daily as needed for mild pain, moderate pain or headache (TAKE WITH MEALS.). 08/29/16   Street, [employer], PA-C  oxyCODONE-acetaminophen (PERCOCET/ROXICET) 5-325 MG tablet Take 2 tablets by mouth every 4 (four) hours as needed. 09/10/16   Tanna Furry, MD    Family History Family History  Problem Relation Age of Onset  . Heart disease Paternal Grandmother     Social History Social History  Substance Use Topics  . Smoking status: Current Every Day Smoker    Packs/day: 0.50    Years: 28.00    Types: Cigarettes  . Smokeless tobacco: Never Used  . Alcohol use Yes     Comment: 1/2 bottle wine on weekends     Allergies   Hydrocodone   Review of Systems Review of Systems  Constitutional: Negative for appetite change, chills, diaphoresis, fatigue and fever.  HENT: Negative for mouth sores, sore throat and trouble swallowing.   Eyes:  Negative for visual disturbance.  Respiratory: Negative for cough, chest tightness, shortness of breath and wheezing.   Cardiovascular: Negative for chest pain.  Gastrointestinal: Negative for abdominal distention, abdominal pain, diarrhea, nausea and vomiting.  Endocrine: Negative for polydipsia, polyphagia and polyuria.  Genitourinary: Positive for vaginal pain. Negative for dysuria, frequency and hematuria.  Musculoskeletal: Negative for gait problem.  Skin: Negative for color change, pallor and rash.  Neurological: Negative for dizziness, syncope, light-headedness and headaches.  Hematological: Does not bruise/bleed easily.  Psychiatric/Behavioral: Negative for  behavioral problems and confusion.     Physical Exam Updated Vital Signs BP 119/71 (BP Location: Right Arm)   Pulse 85   Temp 98.3 F (36.8 C) (Oral)   Resp 18   Ht 5\' 6"  (1.676 m)   Wt 80.4 kg (177 lb 4 oz)   LMP 08/28/2016 (Exact Date)   SpO2 99%   BMI 28.61 kg/m   Physical Exam  Constitutional: She is oriented to person, place, and time. She appears well-developed and well-nourished. No distress.  HENT:  Head: Normocephalic.  Eyes: Conjunctivae are normal. Pupils are equal, round, and reactive to light. No scleral icterus.  Neck: Normal range of motion. Neck supple. No thyromegaly present.  Cardiovascular: Normal rate and regular rhythm.  Exam reveals no gallop and no friction rub.   No murmur heard. Pulmonary/Chest: Effort normal and breath sounds normal. No respiratory distress. She has no wheezes. She has no rales.  Abdominal: Soft. Bowel sounds are normal. She exhibits no distension. There is no tenderness. There is no rebound.  Genitourinary:     Musculoskeletal: Normal range of motion.  Neurological: She is alert and oriented to person, place, and time.  Skin: Skin is warm and dry. No rash noted.  Psychiatric: She has a normal mood and affect. Her behavior is normal.     ED Treatments / Results  Labs (all labs ordered are listed, but only abnormal results are displayed) Labs Reviewed - No data to display  EKG  EKG Interpretation None       Radiology No results found.  Procedures Procedures (including critical care time)  Medications Ordered in ED Medications  lidocaine-EPINEPHrine (XYLOCAINE W/EPI) 1 %-1:100000 (with pres) injection 10 mL (10 mLs Infiltration Not Given 09/10/16 1942)  Morphine Sulfate (PF) SOLN 10 mg (10 mg Intramuscular Given 09/10/16 1939)  ondansetron (ZOFRAN-ODT) disintegrating tablet 4 mg (4 mg Oral Given 09/10/16 1939)  lidocaine-EPINEPHrine (XYLOCAINE W/EPI) 2 %-1:200000 (PF) injection (20 mLs  Given by Other 09/10/16 1939)      Initial Impression / Assessment and Plan / ED Course  I have reviewed the triage vital signs and the nursing notes.  Pertinent labs & imaging results that were available during my care of the patient were reviewed by me and considered in my medical decision making (see chart for details).    INCISION AND DRAINAGE Performed by: Lolita Patella Consent: Verbal consent obtained. Risks and benefits: risks, benefits and alternatives were discussed Type: abscess  Body area: right uppper labia   Anesthesia: local infiltration  Incision was made with a scalpel.  Local anesthetic: lidocaine 1% c epinephrine  Anesthetic total: 2 ml  Complexity: complex Blunt dissection to break up loculations  Drainage: purulent  Drainage amount: moderate, purulent. Swabbed then irigated until clear  Packing material: 1/4 in iodoform gauze  Patient tolerance: Patient tolerated the procedure well with no immediate complications.    Final Clinical Impressions(s) / ED Diagnoses   Final diagnoses:  Abscess  Plan DC home. In 48 hours with soap, and remove gauze. Continue soaks and gentle massage. Vicodin for pain. Doxycycline.  New Prescriptions Discharge Medication List as of 09/10/2016  9:46 PM    START taking these medications   Details  doxycycline (VIBRAMYCIN) 100 MG capsule Take 1 capsule (100 mg total) by mouth 2 (two) times daily., Starting Mon 09/10/2016, Print         Tanna Furry, MD 09/10/16 [phone]

## 2016-09-10 NOTE — Discharge Instructions (Signed)
Soak and remove gauze in 48 hours. Continue soaks and gentle massage am and pm

## 2016-10-24 ENCOUNTER — Emergency Department (HOSPITAL_COMMUNITY)
Admission: EM | Admit: 2016-10-24 | Discharge: 2016-10-24 | Disposition: A | Discharge status: Home / Self Care | Payer: No Typology Code available for payment source | Attending: Emergency Medicine | Admitting: Emergency Medicine

## 2016-10-24 ENCOUNTER — Encounter (HOSPITAL_COMMUNITY): Payer: Self-pay | Admitting: Emergency Medicine

## 2016-10-24 DIAGNOSIS — L723 Sebaceous cyst: Secondary | ICD-10-CM

## 2016-10-24 DIAGNOSIS — F17210 Nicotine dependence, cigarettes, uncomplicated: Secondary | ICD-10-CM | POA: Insufficient documentation

## 2016-10-24 MED ORDER — IBUPROFEN 800 MG PO TABS
800.0000 mg | ORAL_TABLET | Freq: Once | ORAL | Status: AC
  Administered 2016-10-24: 800 mg via ORAL
  Filled 2016-10-24: qty 1

## 2016-10-24 MED ORDER — LIDOCAINE HCL 1 % IJ SOLN
INTRAMUSCULAR | Status: DC
Start: 2016-10-24 — End: 2016-10-24
  Filled 2016-10-24: qty 20

## 2016-10-24 MED ORDER — CEPHALEXIN 500 MG PO CAPS: 500.0000 mg | ORAL_CAPSULE | Freq: Two times a day (BID) | ORAL | 0 refills | Status: DC

## 2016-10-24 NOTE — ED Triage Notes (Signed)
patient c/o abscess on left ear that came up 3 days ago after believes earring irritated it. Patient reports no drainage just increased in size.

## 2016-10-24 NOTE — ED Notes (Signed)
Discharge instructions reviewed with patient. Patient verbalizes understanding. VSS.   

## 2016-10-24 NOTE — Discharge Instructions (Signed)
Please follow attached after care. Please return in 7 days for suture removal. You may follow up at an urgent care, here or with your PCP for this. Please take all of your antibiotics until finished!   You may develop abdominal discomfort or diarrhea from the antibiotic.  You may help offset this with probiotics which you can buy or get in yogurt. Do not eat  or take the probiotics until 2 hours after your antibiotic. If you develop worsening or new concerning symptoms you can return to the emergency department for re-evaluation.

## 2016-10-24 NOTE — ED Provider Notes (Signed)
[facility] DEPT Provider Note   CSN: [id number] Arrival date & time: 10/24/16  1316   By signing my name below, I, [name], attest that this documentation has been prepared under the direction and in the presence of [name], [employer] Electronically Signed: Soijett Johnson, ED Scribe. 10/24/16. 5:26 PM.  History   Chief Complaint Chief Complaint  Patient presents with  . Abscess    HPI [name] is a 46 y.o. female with a PMHx of abscess, who presents to the Emergency Department complaining of abscess to left periauricular region onset 3 days ago. Pt has not tried any medications for the relief of her symptoms. She notes that since the initial onset, the abscess has gradually increased in size. Pt reports that her earring may have irritated the area prior to the onset of her symptoms. She denies fever, chills, drainage, color change, hearing changes, numbness, tingling, jaw pain, and any other symptoms.      The history is provided by the patient. No language interpreter was used.    Past Medical History:  Diagnosis Date  . Abscess   . History of breast abscess 2013  . Rheumatic fever     Patient Active Problem List   Diagnosis Date Noted  . Mastitis 10/02/2013  . Left breast abscess 04/12/2011    Past Surgical History:  Procedure Laterality Date  . BREAST SURGERY    . INCISE AND DRAIN ABCESS     left breast abscess  . Incision & drainage left chest     left chest  . INCISION AND DRAINAGE OF WOUND N/A 09/06/2014   Procedure: IRRIGATION AND DEBRIDEMENT LEFT BREAST ABCESS;  Surgeon: Jackolyn Confer, MD;  Location: [facility];  Service: General;  Laterality: N/A;  . IRRIGATION AND DEBRIDEMENT ABSCESS Left 10/05/2013   Procedure: IRRIGATION AND DEBRIDEMENT left breast ABSCESS;  Surgeon: Merrie Roof, MD;  Location: [facility];  Service: General;  Laterality: Left;  . scar tissue removed      OB History    No data available       Home Medications    Prior  to Admission medications   Medication Sig Start Date End Date Taking? Authorizing Provider  cephALEXin (KEFLEX) 500 MG capsule Take 1 capsule (500 mg total) by mouth 2 (two) times daily. 10/24/16   Arlon Bleier, Barth Kirks, PA-C  doxycycline (VIBRAMYCIN) 100 MG capsule Take 1 capsule (100 mg total) by mouth 2 (two) times daily. 09/10/16   Tanna Furry, MD  ibuprofen (ADVIL,MOTRIN) 200 MG tablet Take 200 mg by mouth every 6 (six) hours as needed for moderate pain.    [provider]  ibuprofen (ADVIL,MOTRIN) 600 MG tablet Take 1 tablet (600 mg total) by mouth every 6 (six) hours as needed. Patient not taking: Reported on 09/10/2016 03/01/16   Avie Echevaria B, PA-C  naproxen (NAPROSYN) 500 MG tablet Take 1 tablet (500 mg total) by mouth 2 (two) times daily as needed for mild pain, moderate pain or headache (TAKE WITH MEALS.). 08/29/16   Street, Cadott, PA-C  oxyCODONE-acetaminophen (PERCOCET/ROXICET) 5-325 MG tablet Take 2 tablets by mouth every 4 (four) hours as needed. 09/10/16   Tanna Furry, MD    Family History Family History  Problem Relation Age of Onset  . Heart disease Paternal Grandmother     Social History Social History  Substance Use Topics  . Smoking status: Current Every Day Smoker    Packs/day: 0.50    Years: 28.00    Types: Cigarettes  .  Smokeless tobacco: Never Used  . Alcohol use Yes     Comment: 1/2 bottle wine on weekends     Allergies   Hydrocodone   Review of Systems Review of Systems  Constitutional: Negative for chills and fever.  HENT: Negative for hearing loss.        +abscess to left periauricular area without drainage.  No jaw pain.  Skin: Negative for color change.  Neurological: Negative for numbness.       No tingling  All other systems reviewed and are negative.    Physical Exam Updated Vital Signs BP 116/80 (BP Location: Left Arm)   Pulse 94   Temp 98.6 F (37 C) (Oral)   Resp 18   Wt 170 lb 3 oz (77.2 kg)   SpO2 98%   BMI 27.47  kg/m   Physical Exam  Constitutional: She appears well-developed and well-nourished.  HENT:  Head: Normocephalic and atraumatic.  Right Ear: Hearing, tympanic membrane, external ear and ear canal normal.  Left Ear: Hearing, tympanic membrane, external ear and ear canal normal.  Nose: Nose normal. No sinus tenderness.  Mouth/Throat: Uvula is midline, oropharynx is clear and moist and mucous membranes are normal.  3 cm fluctuant area proximal to left ear. No erythema or surrounding induration.   Eyes: Conjunctivae are normal. Right eye exhibits no discharge. Left eye exhibits no discharge. No scleral icterus.  Neck: Normal range of motion. Neck supple. No spinous process tenderness present. No neck rigidity. Normal range of motion present.  Pulmonary/Chest: Effort normal. No respiratory distress.  Lymphadenopathy:    She has no cervical adenopathy.  Neurological: She is alert.  Skin: No pallor.  Psychiatric: She has a normal mood and affect.  Nursing note and vitals reviewed.    ED Treatments / Results  DIAGNOSTIC STUDIES: Oxygen Saturation is 98% on RA, nl by my interpretation.    COORDINATION OF CARE: 5:23 PM Discussed treatment plan with pt at bedside and pt agreed to plan.   Labs (all labs ordered are listed, but only abnormal results are displayed) Labs Reviewed - No data to display  EKG  EKG Interpretation None       Radiology No results found.  Procedures .Marland KitchenIncision and Drainage Date/Time: 10/24/2016 5:19 PM Performed by: Jillyn Ledger Authorized by: Jillyn Ledger   Consent:    Consent obtained:  Verbal   Consent given by:  Patient   Risks discussed:  Incomplete drainage, infection and pain Location:    Type:  Cyst (sebaceous cyst)   Size:  3 cm   Location:  Head   Head/neck location: proximal to left ear. Pre-procedure details:    Skin preparation:  Betadine Anesthesia (see MAR for exact dosages):    Anesthesia method:  Local infiltration    Local anesthetic:  Lidocaine 1% w/o epi (2 cc used) Procedure type:    Complexity:  Simple Procedure details:    Needle aspiration: no     Incision types:  Stab incision   Incision depth:  Dermal   Scalpel blade:  11   Wound management:  Probed and deloculated and irrigated with saline   Drainage characteristics: full sebaceous cyst sac.   Packing materials:  None Post-procedure details:    Patient tolerance of procedure:  Tolerated well, no immediate complications Comments:     Affected area not an abscess, but a sebaceous cyst that was removed as a whole. Skin closed with one 6-0 Ethilon suture.       (including  critical care time)  Medications Ordered in ED Medications  ibuprofen (ADVIL,MOTRIN) tablet 800 mg (800 mg Oral Given 10/24/16 1902)     Initial Impression / Assessment and Plan / ED Course  I have reviewed the triage vital signs and the nursing notes.  Pertinent labs & imaging results that were available during my care of the patient were reviewed by me and considered in my medical decision making (see chart for details).     46 year old with with sebaceous cyst confirmed by [place]. Excision performed in the ED today. Cyst removed in it's entirety including cyst wall. Skin closed with one 6-0 Ethilon suture (1 stitch). Patient to return in 7 days for suture removal. Supportive care and return precautions discussed.  Pt sent home with keflex Rx for moderate erythema noted to the left ear concerning for the start of cellulitis (sepearte from where cyst was removed). Patient states she has relief of pain after procedure. The patient appears reasonably screened and/or stabilized for discharge and I doubt any other emergent medical condition requiring further screening, evaluation, or treatment in the ED prior to discharge.  I advised the patient to follow-up with PCP this week. I advised the patient to return to the emergency department with new or worsening symptoms or new  concerns. Specific return precautions discussed. The patient verbalized understanding and agreement with plan. All questions answered. No further questions at this time. The patient is hemodynamically stable, mentating appropriately and appears safe for discharge.  Final Clinical Impressions(s) / ED Diagnoses   Final diagnoses:  Sebaceous cyst    New Prescriptions Discharge Medication List as of 10/24/2016  6:55 PM     I personally performed the services described in this documentation, which was scribed in my presence. The recorded information has been reviewed and is accurate.     Jillyn Ledger, PA-C 10/25/16 0144    Dorie Rank, MD 10/25/16 1425

## 2016-11-03 ENCOUNTER — Emergency Department (HOSPITAL_COMMUNITY)
Admission: EM | Admit: 2016-11-03 | Discharge: 2016-11-03 | Disposition: A | Discharge status: Home / Self Care | Payer: Self-pay | Attending: Emergency Medicine | Admitting: Emergency Medicine

## 2016-11-03 ENCOUNTER — Emergency Department (HOSPITAL_COMMUNITY): Payer: Self-pay

## 2016-11-03 ENCOUNTER — Encounter (HOSPITAL_COMMUNITY): Payer: Self-pay | Admitting: Emergency Medicine

## 2016-11-03 DIAGNOSIS — Y990 Civilian activity done for income or pay: Secondary | ICD-10-CM | POA: Insufficient documentation

## 2016-11-03 DIAGNOSIS — M25552 Pain in left hip: Secondary | ICD-10-CM | POA: Insufficient documentation

## 2016-11-03 DIAGNOSIS — Y92019 Unspecified place in single-family (private) house as the place of occurrence of the external cause: Secondary | ICD-10-CM | POA: Insufficient documentation

## 2016-11-03 DIAGNOSIS — M25472 Effusion, left ankle: Secondary | ICD-10-CM | POA: Insufficient documentation

## 2016-11-03 DIAGNOSIS — S8264XA Nondisplaced fracture of lateral malleolus of right fibula, initial encounter for closed fracture: Secondary | ICD-10-CM | POA: Insufficient documentation

## 2016-11-03 DIAGNOSIS — M25572 Pain in left ankle and joints of left foot: Secondary | ICD-10-CM | POA: Insufficient documentation

## 2016-11-03 DIAGNOSIS — M791 Myalgia: Secondary | ICD-10-CM | POA: Insufficient documentation

## 2016-11-03 DIAGNOSIS — Y9301 Activity, walking, marching and hiking: Secondary | ICD-10-CM | POA: Insufficient documentation

## 2016-11-03 DIAGNOSIS — M79652 Pain in left thigh: Secondary | ICD-10-CM | POA: Insufficient documentation

## 2016-11-03 DIAGNOSIS — Z79899 Other long term (current) drug therapy: Secondary | ICD-10-CM | POA: Insufficient documentation

## 2016-11-03 DIAGNOSIS — W108XXA Fall (on) (from) other stairs and steps, initial encounter: Secondary | ICD-10-CM | POA: Insufficient documentation

## 2016-11-03 DIAGNOSIS — S82832A Other fracture of upper and lower end of left fibula, initial encounter for closed fracture: Secondary | ICD-10-CM

## 2016-11-03 DIAGNOSIS — F17210 Nicotine dependence, cigarettes, uncomplicated: Secondary | ICD-10-CM | POA: Insufficient documentation

## 2016-11-03 MED ORDER — OXYCODONE-ACETAMINOPHEN 5-325 MG PO TABS
1.0000 | ORAL_TABLET | Freq: Once | ORAL | Status: AC
  Administered 2016-11-03: 1 via ORAL
  Filled 2016-11-03: qty 1

## 2016-11-03 MED ORDER — DICLOFENAC SODIUM 50 MG PO TBEC: 50.0000 mg | DELAYED_RELEASE_TABLET | Freq: Two times a day (BID) | ORAL | 0 refills | Status: DC

## 2016-11-03 MED ORDER — OXYCODONE-ACETAMINOPHEN 5-325 MG PO TABS: 1.0000 | ORAL_TABLET | Freq: Four times a day (QID) | ORAL | 0 refills | Status: DC | PRN

## 2016-11-03 NOTE — Discharge Instructions (Signed)
Elevate the area as often as possible.  Do not drive while taking the narcotic as it will make you sleepy.  Follow up with DR. Lorin Mercy, call his office for an appointment.

## 2016-11-03 NOTE — ED Triage Notes (Signed)
Patient here with complaints of fall last Sunday down stairs on porch. Pain to left hip down to left ankle. Swelling and bruising noted.

## 2016-11-03 NOTE — ED Provider Notes (Signed)
[facility] DEPT Provider Note   CSN: [id number] Arrival date & time: 11/03/16  1128  By signing my name below, I, Marcello Moores, attest that this documentation has been prepared under the direction and in the presence of Debroah Baller, NP Electronically Signed: Marcello Moores, ED Scribe. 11/03/16. 1:33 PM.  History   Chief Complaint Chief Complaint  Patient presents with  . Fall  . Foot Pain  . Hip Pain   The history is provided by the patient. No language interpreter was used.  Fall  This is a new problem. The current episode started more than 2 days ago. The problem occurs constantly. The problem has been gradually worsening. Pertinent negatives include no abdominal pain, no headaches and no shortness of breath. Nothing aggravates the symptoms. Nothing relieves the symptoms. She has tried a cold compress and acetaminophen for the symptoms. The treatment provided mild relief.  Hip Pain  This is a new problem. The current episode started more than 2 days ago. The problem occurs constantly. The problem has been gradually worsening. Pertinent negatives include no abdominal pain, no headaches and no shortness of breath. Nothing aggravates the symptoms. The symptoms are relieved by ice. She has tried a cold compress and acetaminophen for the symptoms. The treatment provided mild relief.  Ankle Pain   The incident occurred more than 2 days ago. The incident occurred at work. The injury mechanism was a fall. The pain is present in the left leg, left ankle and left hip. The pain is moderate. The pain has been worsening since onset. Pertinent negatives include no numbness. She reports no foreign bodies present. She has tried ice and acetaminophen for the symptoms. The treatment provided mild relief.   HPI Comments: [name] is a 46 y.o. female who presents to the Emergency Department complaining of "sore" left hip and left ankle pain with associated joint swelling s/p a mechanical fall  that occurred on 10/28/2016. The pt states that she was taking the trash out of her house, when she slipped, fell forward down 6 steps, and landed on her left side. She states that she tried ibuprofen and applying a cool compress with inadequate relief. She denies fever and numbness.patient reports that at first there was no swelling to the ankle but the more she walks the more it swells and hurts.   Past Medical History:  Diagnosis Date  . Abscess   . History of breast abscess 2013  . Rheumatic fever     Patient Active Problem List   Diagnosis Date Noted  . Mastitis 10/02/2013  . Left breast abscess 04/12/2011    Past Surgical History:  Procedure Laterality Date  . BREAST SURGERY    . INCISE AND DRAIN ABCESS     left breast abscess  . Incision & drainage left chest     left chest  . INCISION AND DRAINAGE OF WOUND N/A 09/06/2014   Procedure: IRRIGATION AND DEBRIDEMENT LEFT BREAST ABCESS;  Surgeon: Jackolyn Confer, MD;  Location: [facility];  Service: General;  Laterality: N/A;  . IRRIGATION AND DEBRIDEMENT ABSCESS Left 10/05/2013   Procedure: IRRIGATION AND DEBRIDEMENT left breast ABSCESS;  Surgeon: Merrie Roof, MD;  Location: [facility];  Service: General;  Laterality: Left;  . scar tissue removed      OB History    No data available       Home Medications    Prior to Admission medications   Medication Sig Start Date End Date Taking? Authorizing Provider  cephALEXin (  KEFLEX) 500 MG capsule Take 1 capsule (500 mg total) by mouth 2 (two) times daily. 10/24/16   Maczis, Barth Kirks, PA-C  diclofenac (VOLTAREN) 50 MG EC tablet Take 1 tablet (50 mg total) by mouth 2 (two) times daily. 11/03/16   Ashley Murrain, NP  doxycycline (VIBRAMYCIN) 100 MG capsule Take 1 capsule (100 mg total) by mouth 2 (two) times daily. 09/10/16   Tanna Furry, MD  oxyCODONE-acetaminophen (ROXICET) 5-325 MG tablet Take 1 tablet by mouth every 6 (six) hours as needed for severe pain. 11/03/16   Ashley Murrain, NP     Family History Family History  Problem Relation Age of Onset  . Heart disease Paternal Grandmother     Social History Social History  Substance Use Topics  . Smoking status: Current Every Day Smoker    Packs/day: 0.50    Years: 28.00    Types: Cigarettes  . Smokeless tobacco: Never Used  . Alcohol use Yes     Comment: 1/2 bottle wine on weekends     Allergies   Hydrocodone   Review of Systems Review of Systems  Constitutional: Negative for fever.  HENT: Negative.   Eyes: Negative for visual disturbance.  Respiratory: Negative for shortness of breath.   Gastrointestinal: Negative for abdominal pain.  Musculoskeletal: Positive for arthralgias, joint swelling and myalgias.  Skin: Positive for color change.  Neurological: Negative for syncope, numbness and headaches.  Psychiatric/Behavioral: The patient is not nervous/anxious.      Physical Exam Updated Vital Signs BP 110/74 (BP Location: Left Arm)   Pulse 77   Temp 98.4 F (36.9 C) (Oral)   Resp 18   SpO2 99%   Physical Exam  Constitutional: She appears well-developed and well-nourished. No distress.  HENT:  Head: Normocephalic and atraumatic.  Eyes: EOM are normal.  Neck: Normal range of motion.  Cardiovascular: Normal rate and intact distal pulses.   Pulmonary/Chest: Effort normal.  Musculoskeletal: She exhibits tenderness.       Left ankle: She exhibits decreased range of motion, swelling and ecchymosis. She exhibits no deformity, no laceration and normal pulse. Tenderness. Lateral malleolus and medial malleolus tenderness found. Achilles tendon normal.  Swelling to the medial and lateral aspects of the left ankle. Tenderness and swelling to the left lower lateral leg. Ecchymosis noted to lateral left aspect of left knee and left thigh. Ecchymosis additionally noted to the right upper arm. Pedal pulses 2+, adequate circulation.   Neurological: She is alert.  Skin: Skin is warm and dry.  Psychiatric:  She has a normal mood and affect.  Nursing note and vitals reviewed.    ED Treatments / Results   DIAGNOSTIC STUDIES: Oxygen Saturation is 100% on RA, normal by my interpretation.   COORDINATION OF CARE: 1:22 PM-Discussed next steps with pt. Pt verbalized understanding and is agreeable with the plan.   Labs (all labs ordered are listed, but only abnormal results are displayed) Labs Reviewed - No data to display  Radiology Dg Tibia/fibula Left  Result Date: 11/03/2016 CLINICAL DATA:  Pain following recent fall EXAM: LEFT TIBIA AND FIBULA - 2 VIEW COMPARISON:  Left ankle November 03, 2016 FINDINGS: Frontal and lateral views were obtained. The subtle fracture of the distal fibula seen on ankle images is not appreciable on this current series. On this current series, there is no evident fracture or dislocation. No near ankle joint effusion evident. No abnormal periosteal reaction. IMPRESSION: No fracture or dislocation. Subtle fracture involving the distal fibula seen on  ankle images is not seen on this series. No abnormal periosteal reaction. No appreciable arthropathy. Electronically Signed   By: Lowella Grip III M.D.   On: 11/03/2016 14:01   Dg Ankle Complete Left  Result Date: 11/03/2016 CLINICAL DATA:  Pain and swelling following recent fall EXAM: LEFT ANKLE COMPLETE - 3+ VIEW COMPARISON:  None. FINDINGS: Frontal, oblique, and lateral views were obtained. There is diffuse soft tissue swelling. There is an obliquely oriented incomplete fracture in the distal fibular diaphysis, seen only on the oblique view. Alignment anatomic in this area. No other evidence of fracture. There is no appreciable joint effusion. No appreciable joint space narrowing or erosion. Ankle mortise appears intact. IMPRESSION: Incomplete fracture distal fibular diaphysis with alignment anatomic at fracture site. No other fracture. Diffuse soft tissue swelling. Ankle mortise appears intact. No appreciable joint space  narrowing. Electronically Signed   By: Lowella Grip III M.D.   On: 11/03/2016 13:59    Procedures Procedures (including critical care time)  Medications Ordered in ED Medications  oxyCODONE-acetaminophen (PERCOCET/ROXICET) 5-325 MG per tablet 1 tablet (1 tablet Oral Given 11/03/16 1344)     Initial Impression / Assessment and Plan / ED Course  I have reviewed the triage vital signs and the nursing notes.  Pertinent imaging results that were available during my care of the patient were reviewed by me and considered in my medical decision making (see chart for details).  46 y.o. female with left thigh, lower leg and ankle pain stable for d/c without focal neuro deficits. X-rays positive for fibula fracture. Posterior splint applied, ice, crutches, pain management and f/u with ortho. Return precautions discussed.  Final Clinical Impressions(s) / ED Diagnoses   Final diagnoses:  Traumatic closed nondisplaced fracture of distal fibula, left, initial encounter    New Prescriptions New Prescriptions   DICLOFENAC (VOLTAREN) 50 MG EC TABLET    Take 1 tablet (50 mg total) by mouth 2 (two) times daily.   OXYCODONE-ACETAMINOPHEN (ROXICET) 5-325 MG TABLET    Take 1 tablet by mouth every 6 (six) hours as needed for severe pain.   I personally performed the services described in this documentation, which was scribed in my presence. The recorded information has been reviewed and is accurate.     Debroah Baller [place], [place] 11/03/16 1429    Gareth Morgan, MD 11/05/16 1224

## 2016-11-07 ENCOUNTER — Ambulatory Visit (INDEPENDENT_AMBULATORY_CARE_PROVIDER_SITE_OTHER): Payer: Self-pay | Admitting: Orthopaedic Surgery

## 2016-11-07 ENCOUNTER — Encounter (INDEPENDENT_AMBULATORY_CARE_PROVIDER_SITE_OTHER): Payer: Self-pay | Admitting: Orthopaedic Surgery

## 2016-11-07 VITALS — BP 121/77 | HR 94 | Ht 66.0 in | Wt 170.0 lb

## 2016-11-07 DIAGNOSIS — W109XXA Fall (on) (from) unspecified stairs and steps, initial encounter: Secondary | ICD-10-CM

## 2016-11-07 DIAGNOSIS — S82832A Other fracture of upper and lower end of left fibula, initial encounter for closed fracture: Secondary | ICD-10-CM

## 2016-11-07 MED ORDER — TRAMADOL HCL 50 MG PO TABS: 50.0000 mg | ORAL_TABLET | Freq: Four times a day (QID) | ORAL | 0 refills | Status: DC | PRN

## 2016-11-07 NOTE — Addendum Note (Signed)
Addended by: Marybelle Killings on: 11/07/2016 02:20 PM   Modules accepted: Orders

## 2016-11-07 NOTE — Progress Notes (Signed)
Office Visit Note   Patient: [name]           Date of Birth: 06/09/1970           MRN: [id number] Visit Date: 11/07/2016              Requested by: No referring provider defined for this encounter. PCP: Patient, No Pcp Per   Assessment & Plan: Visit Diagnoses:  1. Closed fracture of distal end of left fibula, unspecified fracture morphology, initial encounter     Plan: She'll S fiberglass cast applied. Also 5 weeks for cast off and exam.  Follow-Up Instructions: Return in about 5 weeks (around 12/12/2016).   Orders:  No orders of the defined types were placed in this encounter.  No orders of the defined types were placed in this encounter.     Procedures: No procedures performed   Clinical Data: No additional findings.   Subjective: Chief Complaint  Patient presents with  . 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 She bruised her left thigh over the trochanter distal knee and also injured her left ankle. Initially walked in a few days pain got worse and she was seen in emergency room her x-rays were taken which demonstrated a nondisplaced lateral malleolar fracture. There was no widening of the medial clear space no shifting of the mortise  Review of Systems previous left breast abscess. She had previous subtendinous abscesses.   Objective: Vital Signs: BP 121/77   Pulse 94   Ht 5\' 6"  (1.676 m)   Wt 170 lb (77.1 kg)   BMI 27.44 kg/m   Physical Exam  Constitutional: She is oriented to person, place, and time. She appears well-developed.  HENT:  Head: Normocephalic.  Right Ear: External ear normal.  Left Ear: External ear normal.  Eyes: Pupils are equal, round, and reactive to light.  Neck: No tracheal deviation present. No thyromegaly present.  Cardiovascular: Normal rate.     Pulmonary/Chest: Effort normal.  Abdominal: Soft.  Musculoskeletal:  Patient's and left short leg splint she has some ecchymosis over the lateral knee and lateral thigh distal to the trochanter from the thigh contusion. No injury to the right lower extremity she does have some bruising over her right arm. Sensation to her toes are intact  Neurological: She is alert and oriented to person, place, and time.  Skin: Skin is warm and dry.  Psychiatric: She has a normal mood and affect. Her behavior is normal.    Ortho Exam  Specialty Comments:  No specialty comments available.  Imaging: No results found.   PMFS History: Patient Active Problem List   Diagnosis Date Noted  . Mastitis 10/02/2013  . Left breast abscess 04/12/2011   Past Medical History:  Diagnosis Date  . Abscess   . History of breast abscess 2013  . Rheumatic fever     Family History  Problem Relation Age of Onset  . Heart disease Paternal Grandmother     Past Surgical History:  Procedure Laterality Date  . BREAST SURGERY    . INCISE AND DRAIN ABCESS     left breast abscess  . Incision & drainage left chest     left chest  . INCISION AND DRAINAGE OF WOUND N/A 09/06/2014   Procedure: IRRIGATION AND DEBRIDEMENT LEFT BREAST  ABCESS;  Surgeon: Jackolyn Confer, MD;  Location: [facility];  Service: General;  Laterality: N/A;  . IRRIGATION AND DEBRIDEMENT ABSCESS Left 10/05/2013   Procedure: IRRIGATION AND DEBRIDEMENT left breast ABSCESS;  Surgeon: Merrie Roof, MD;  Location: [facility];  Service: General;  Laterality: Left;  . scar tissue removed     Social History   Occupational History  . Not on file.   Social History Main Topics  . Smoking status: Current Every Day Smoker    Packs/day: 0.50    Years: 28.00    Types: Cigarettes  . Smokeless tobacco: Never Used  . Alcohol use Yes     Comment: 1/2 bottle wine on weekends  . Drug use: No  . Sexual activity: No

## 2016-11-22 ENCOUNTER — Encounter (INDEPENDENT_AMBULATORY_CARE_PROVIDER_SITE_OTHER): Payer: Self-pay | Admitting: Surgery

## 2016-11-22 ENCOUNTER — Ambulatory Visit (INDEPENDENT_AMBULATORY_CARE_PROVIDER_SITE_OTHER): Payer: Self-pay | Admitting: Surgery

## 2016-11-22 ENCOUNTER — Ambulatory Visit (INDEPENDENT_AMBULATORY_CARE_PROVIDER_SITE_OTHER): Payer: Self-pay

## 2016-11-22 ENCOUNTER — Emergency Department (HOSPITAL_COMMUNITY)
Admission: EM | Admit: 2016-11-22 | Discharge: 2016-11-22 | Discharge status: Against Medical Advice | Payer: No Typology Code available for payment source

## 2016-11-22 VITALS — BP 136/83 | HR 88 | Ht 66.0 in | Wt 170.0 lb

## 2016-11-22 DIAGNOSIS — M79662 Pain in left lower leg: Secondary | ICD-10-CM

## 2016-11-22 DIAGNOSIS — S82832A Other fracture of upper and lower end of left fibula, initial encounter for closed fracture: Secondary | ICD-10-CM

## 2016-11-22 MED FILL — traMADol HCL 50 MG TABS: 50 | 5 days supply | Qty: 20 | Fill #0

## 2016-11-22 NOTE — Patient Instructions (Signed)
Strict nonweightbearing left lower extremity.  Must keep left foot elevated as much as possible to help decrease pain and swelling.

## 2016-11-22 NOTE — ED Notes (Signed)
Pt stated she was going back to her doctor in the morning. She didn't want to stay.

## 2016-11-22 NOTE — Progress Notes (Addendum)
Office Visit Note   Patient: [name]           Date of Birth: 1970/06/15           MRN: [id number] Visit Date: 11/22/2016              Requested by: No referring provider defined for this encounter. PCP: Patient, No Pcp Per   Assessment & Plan: Visit Diagnoses:  1. Closed fracture of distal end of left fibula, unspecified fracture morphology, initial encounter   2. Pain of left calf     Plan:  With the calf pain and swelling that patient has on exam I will send her for stat venous Doppler left lower extremity to rule out DVT. Patient was put in a boot but after the study is done she will need to return back to the office for cast if Doppler study is negative. Still must be nonweightbearing. I'll await callback report. Was advised by our scheduler that none of the imaging offices are able to do Doppler studies this afternoon and it would have to be done in the morning. Will advise patient to go to the emergency room for evaluation and have this study done there. I did call [facility] advised the charge nurse Catalina Antigua the patient will be on her way shortly.  Follow-Up Instructions: Return in about 2 weeks (around 12/06/2016) for Dr. Lorin Mercy.   Orders:  Orders Placed This Encounter  Procedures  . XR Ankle Complete Left   No orders of the defined types were placed in this encounter.     Procedures: No procedures performed   Clinical Data: No additional findings.   Subjective: Chief Complaint  Patient presents with  . Left Ankle - Pain    HPI Patient returns for an earlier appointment is scheduled due to increased pain left ankle. Patient is in a short leg fiberglass cast and does admit to weightbearing in this. She was previously instructed to be nonweightbearing. Since she has been noncompliant she's been having increased pain in her ankle and calf and states that the cast feels tight around the ankle.  Review of Systems No current cardiac pulmonary GI GU  issues.  Objective: Vital Signs: BP 136/83 (BP Location: Left Arm, Patient Position: Sitting)   Pulse 88   Ht 5\' 6"  (1.676 m)   Wt 170 lb (77.1 kg)   BMI 27.44 kg/m   Physical Exam  Constitutional: She is oriented to person, place, and time.  Eyes: Pupils are equal, round, and reactive to light. EOM are normal.  Neck: Normal range of motion.  Pulmonary/Chest: No respiratory distress.  Neurological: She is alert and oriented to person, place, and time.  Skin: Skin is warm and dry.    Ortho Exam  Specialty Comments:  No specialty comments available.  Imaging: Xr Ankle Complete Left  Result Date: 11/22/2016 X-ray today shows that the fibula fracture has not displaced. Stable from previous films.    PMFS History: Patient Active Problem List   Diagnosis Date Noted  . Mastitis 10/02/2013  . Left breast abscess 04/12/2011   Past Medical History:  Diagnosis Date  . Abscess   . History of breast abscess 2013  . Rheumatic fever     Family History  Problem Relation Age of Onset  . Heart disease Paternal Grandmother     Past Surgical History:  Procedure Laterality Date  . BREAST SURGERY    . INCISE AND DRAIN ABCESS     left breast  abscess  . Incision & drainage left chest     left chest  . INCISION AND DRAINAGE OF WOUND N/A 09/06/2014   Procedure: IRRIGATION AND DEBRIDEMENT LEFT BREAST ABCESS;  Surgeon: Jackolyn Confer, MD;  Location: [facility];  Service: General;  Laterality: N/A;  . IRRIGATION AND DEBRIDEMENT ABSCESS Left 10/05/2013   Procedure: IRRIGATION AND DEBRIDEMENT left breast ABSCESS;  Surgeon: Merrie Roof, MD;  Location: [facility];  Service: General;  Laterality: Left;  . scar tissue removed     Social History   Occupational History  . Not on file.   Social History Main Topics  . Smoking status: Current Every Day Smoker    Packs/day: 0.50    Years: 28.00    Types: Cigarettes  . Smokeless tobacco: Never Used  . Alcohol use Yes     Comment: 1/2 bottle  wine on weekends  . Drug use: No  . Sexual activity: No

## 2016-11-23 ENCOUNTER — Ambulatory Visit (INDEPENDENT_AMBULATORY_CARE_PROVIDER_SITE_OTHER): Payer: Self-pay

## 2016-11-23 ENCOUNTER — Encounter (INDEPENDENT_AMBULATORY_CARE_PROVIDER_SITE_OTHER): Payer: Self-pay

## 2016-11-23 ENCOUNTER — Ambulatory Visit (HOSPITAL_COMMUNITY)
Admission: RE | Admit: 2016-11-23 | Discharge: 2016-11-23 | Disposition: A | Discharge status: Home / Self Care | Payer: Self-pay | Source: Ambulatory Visit | Attending: Surgery | Admitting: Surgery

## 2016-11-23 DIAGNOSIS — S82832A Other fracture of upper and lower end of left fibula, initial encounter for closed fracture: Secondary | ICD-10-CM | POA: Insufficient documentation

## 2016-11-23 DIAGNOSIS — M79662 Pain in left lower leg: Secondary | ICD-10-CM | POA: Insufficient documentation

## 2016-11-23 DIAGNOSIS — X58XXXA Exposure to other specified factors, initial encounter: Secondary | ICD-10-CM | POA: Insufficient documentation

## 2016-11-23 NOTE — Progress Notes (Signed)
*  PRELIMINARY RESULTS* Vascular Ultrasound Left lower extremity venous duplex has been completed.  Preliminary findings: No evidence of deep vein thrombosis or baker's cyst in the left lower extremity.  Irregular fluid collection of unknown etiology seen lateral left ankle.  Preliminary results given to Benjiman Core @ 11:21.   Everrett Coombe 11/23/2016, 11:20 AM

## 2016-12-11 ENCOUNTER — Ambulatory Visit (INDEPENDENT_AMBULATORY_CARE_PROVIDER_SITE_OTHER): Payer: Self-pay | Admitting: Orthopaedic Surgery

## 2016-12-12 ENCOUNTER — Encounter (INDEPENDENT_AMBULATORY_CARE_PROVIDER_SITE_OTHER): Payer: Self-pay | Admitting: Surgery

## 2016-12-12 ENCOUNTER — Ambulatory Visit (INDEPENDENT_AMBULATORY_CARE_PROVIDER_SITE_OTHER): Payer: Self-pay

## 2016-12-12 ENCOUNTER — Ambulatory Visit (INDEPENDENT_AMBULATORY_CARE_PROVIDER_SITE_OTHER): Payer: Self-pay | Admitting: Surgery

## 2016-12-12 DIAGNOSIS — M25572 Pain in left ankle and joints of left foot: Secondary | ICD-10-CM

## 2016-12-12 NOTE — Progress Notes (Signed)
Office Visit Note   Patient: [name]           Date of Birth: Oct 20, 1970           MRN: [id number] Visit Date: 12/12/2016              Requested by: No referring provider defined for this encounter. PCP: Patient, No Pcp Per   Assessment & Plan: Visit Diagnoses:  1. Pain in left ankle and joints of left foot     Plan: Patient was put into a cam walker today. Reviewed x-rays with Dr. Lorin Mercy. Patient okay to weight-bear as tolerated in her boot only. Advised patient that if she continues to be noncompliant and tries to go without the boot she does put her at risk for reinjury that could lead to her needing surgery. She voices understanding. Continue elevation. Recommend start weightbearing with crutches to see how she feels. Follow-up in a few weeks for recheck.  Follow-Up Instructions: Return in about 4 weeks (around 01/09/2017) for Recheck ankle.   Orders:  Orders Placed This Encounter  Procedures  . XR Ankle Complete Left   No orders of the defined types were placed in this encounter.     Procedures: No procedures performed   Clinical Data: No additional findings.   Subjective: Chief Complaint  Patient presents with  . Left Ankle - Pain, Follow-up    HPI Patient returns. She is about 6 weeks out from distal fibula fracture. She has been in a cast and again she admits to being noncompliant with nonweightbearing restrictions. She admits to pain around the lateral ankle.   Review of Systems No cardiac, pulmonary GI GU issues  Objective: Vital Signs: There were no vitals taken for this visit.  Physical Exam  Constitutional: She appears well-developed.  HENT:  Head: Normocephalic.  Eyes: Pupils are equal, round, and reactive to light. EOM are normal.    Ortho Exam  Specialty Comments:  No specialty comments available.  Imaging: No results found.   PMFS History: Patient Active Problem List   Diagnosis Date Noted  . Mastitis 10/02/2013  . Left  breast abscess 04/12/2011   Past Medical History:  Diagnosis Date  . Abscess   . History of breast abscess 2013  . Rheumatic fever     Family History  Problem Relation Age of Onset  . Heart disease Paternal Grandmother     Past Surgical History:  Procedure Laterality Date  . BREAST SURGERY    . INCISE AND DRAIN ABCESS     left breast abscess  . Incision & drainage left chest     left chest  . INCISION AND DRAINAGE OF WOUND N/A 09/06/2014   Procedure: IRRIGATION AND DEBRIDEMENT LEFT BREAST ABCESS;  Surgeon: Jackolyn Confer, MD;  Location: [facility];  Service: General;  Laterality: N/A;  . IRRIGATION AND DEBRIDEMENT ABSCESS Left 10/05/2013   Procedure: IRRIGATION AND DEBRIDEMENT left breast ABSCESS;  Surgeon: Merrie Roof, MD;  Location: [facility];  Service: General;  Laterality: Left;  . scar tissue removed     Social History   Occupational History  . Not on file.   Social History Main Topics  . Smoking status: Current Every Day Smoker    Packs/day: 0.50    Years: 28.00    Types: Cigarettes  . Smokeless tobacco: Never Used  . Alcohol use Yes     Comment: 1/2 bottle wine on weekends  . Drug use: No  . Sexual activity: No

## 2016-12-25 ENCOUNTER — Emergency Department (HOSPITAL_COMMUNITY)
Admission: EM | Admit: 2016-12-25 | Discharge: 2016-12-25 | Disposition: A | Discharge status: Home / Self Care | Payer: Self-pay | Attending: Emergency Medicine | Admitting: Emergency Medicine

## 2016-12-25 ENCOUNTER — Encounter (HOSPITAL_COMMUNITY): Payer: Self-pay

## 2016-12-25 DIAGNOSIS — Z79899 Other long term (current) drug therapy: Secondary | ICD-10-CM | POA: Insufficient documentation

## 2016-12-25 DIAGNOSIS — L02411 Cutaneous abscess of right axilla: Secondary | ICD-10-CM | POA: Insufficient documentation

## 2016-12-25 DIAGNOSIS — L0291 Cutaneous abscess, unspecified: Secondary | ICD-10-CM

## 2016-12-25 DIAGNOSIS — F17210 Nicotine dependence, cigarettes, uncomplicated: Secondary | ICD-10-CM | POA: Insufficient documentation

## 2016-12-25 HISTORY — DX: Other fracture of unspecified lower leg, initial encounter for closed fracture: S82.899A

## 2016-12-25 MED ORDER — OXYCODONE-ACETAMINOPHEN 5-325 MG PO TABS
1.0000 | ORAL_TABLET | Freq: Once | ORAL | Status: AC
  Administered 2016-12-25: 1 via ORAL
  Filled 2016-12-25: qty 1

## 2016-12-25 MED ORDER — SULFAMETHOXAZOLE-TRIMETHOPRIM 800-160 MG PO TABS: 1.0000 | ORAL_TABLET | Freq: Two times a day (BID) | ORAL | 0 refills | Status: AC

## 2016-12-25 MED ORDER — LIDOCAINE HCL 2 % EX GEL
1.0000 "application " | Freq: Once | CUTANEOUS | Status: AC
  Administered 2016-12-25: 1 via TOPICAL
  Filled 2016-12-25: qty 11

## 2016-12-25 MED ORDER — LIDOCAINE HCL (PF) 1 % IJ SOLN
5.0000 mL | Freq: Once | INTRAMUSCULAR | Status: AC
  Administered 2016-12-25: 5 mL
  Filled 2016-12-25: qty 5

## 2016-12-25 MED ORDER — CEPHALEXIN 500 MG PO CAPS: 500.0000 mg | ORAL_CAPSULE | Freq: Four times a day (QID) | ORAL | 0 refills | Status: DC

## 2016-12-25 MED ORDER — NAPROXEN 500 MG PO TABS: 500.0000 mg | ORAL_TABLET | Freq: Two times a day (BID) | ORAL | 0 refills | Status: DC

## 2016-12-25 NOTE — Discharge Instructions (Signed)
You will need to follow up in 2 days for wound check and packing removal. Take the medications as directed.

## 2016-12-25 NOTE — ED Triage Notes (Signed)
Patient c/o right axilla abscess x 3 days.

## 2016-12-25 NOTE — ED Provider Notes (Signed)
[facility] DEPT Provider Note   CSN: [id number] Arrival date & time: 12/25/16  0827     History   Chief Complaint Chief Complaint  Patient presents with  . Abscess    HPI [name] is a 46 y.o. female who presents to the ED with pain and swelling to the right axilla that started 3 days ago and has gotten worse.   The history is provided by the patient. No language interpreter was used.  Abscess  Location:  Shoulder/arm Abscess quality: fluctuance, painful, redness and warmth   Red streaking: no   Duration:  3 days Progression:  Worsening Pain details:    Quality:  Sharp   Timing:  Constant   Progression:  Worsening Relieved by:  Nothing Ineffective treatments:  Warm compresses Associated symptoms: no fever, no headaches and no vomiting   Risk factors: prior abscess     Past Medical History:  Diagnosis Date  . Abscess   . Ankle fracture   . History of breast abscess 2013  . Rheumatic fever     Patient Active Problem List   Diagnosis Date Noted  . Mastitis 10/02/2013  . Left breast abscess 04/12/2011    Past Surgical History:  Procedure Laterality Date  . BREAST SURGERY    . INCISE AND DRAIN ABCESS     left breast abscess  . Incision & drainage left chest     left chest  . INCISION AND DRAINAGE OF WOUND N/A 09/06/2014   Procedure: IRRIGATION AND DEBRIDEMENT LEFT BREAST ABCESS;  Surgeon: Jackolyn Confer, MD;  Location: [facility];  Service: General;  Laterality: N/A;  . IRRIGATION AND DEBRIDEMENT ABSCESS Left 10/05/2013   Procedure: IRRIGATION AND DEBRIDEMENT left breast ABSCESS;  Surgeon: Merrie Roof, MD;  Location: [facility];  Service: General;  Laterality: Left;  . scar tissue removed      OB History    No data available       Home Medications    Prior to Admission medications   Medication Sig Start Date End Date Taking? Authorizing Provider  cephALEXin (KEFLEX) 500 MG capsule Take 1 capsule (500 mg total) by mouth 4 (four) times daily.  12/25/16   Ashley Murrain, NP  doxycycline (VIBRAMYCIN) 100 MG capsule Take 1 capsule (100 mg total) by mouth 2 (two) times daily. 09/10/16   Tanna Furry, MD  naproxen (NAPROSYN) 500 MG tablet Take 1 tablet (500 mg total) by mouth 2 (two) times daily. 12/25/16   Ashley Murrain, NP  oxyCODONE-acetaminophen (ROXICET) 5-325 MG tablet Take 1 tablet by mouth every 6 (six) hours as needed for severe pain. 11/03/16   Ashley Murrain, NP  sulfamethoxazole-trimethoprim (BACTRIM DS,SEPTRA DS) 800-160 MG tablet Take 1 tablet by mouth 2 (two) times daily. 12/25/16 01/01/17  Ashley Murrain, NP  traMADol (ULTRAM) 50 MG tablet Take 1 tablet (50 mg total) by mouth every 6 (six) hours as needed. 11/07/16   Marybelle Killings, MD    Family History Family History  Problem Relation Age of Onset  . Heart disease Paternal Grandmother     Social History Social History  Substance Use Topics  . Smoking status: Current Every Day Smoker    Packs/day: 0.50    Years: 28.00    Types: Cigarettes  . Smokeless tobacco: Never Used  . Alcohol use Yes     Comment: 1/2 bottle wine on weekends     Allergies   Hydrocodone   Review of Systems Review of Systems  Constitutional: Negative for chills and fever.  HENT: Negative.   Gastrointestinal: Negative for abdominal pain and vomiting.  Genitourinary: Negative for dysuria, frequency and urgency.  Musculoskeletal: Positive for arthralgias.  Skin: Positive for rash.  Neurological: Negative for headaches.  Psychiatric/Behavioral: Negative for confusion.     Physical Exam Updated Vital Signs BP 124/80   Pulse 80   Temp 98.1 F (36.7 C)   Resp 18   Ht 5\' 6"  (1.676 m)   Wt 75.8 kg (167 lb)   SpO2 98%   BMI 26.95 kg/m   Physical Exam  Constitutional: She is oriented to person, place, and time. She appears well-developed and well-nourished. No distress.  Eyes: EOM are normal.  Neck: Neck supple.  Cardiovascular: Normal rate.   Pulmonary/Chest: Effort normal.    Musculoskeletal: Normal range of motion.  Neurological: She is alert and oriented to person, place, and time.  Skin: Skin is warm and dry.  Right axilla with raised, tender, fluctuant area. No red streaking.    Psychiatric: She has a normal mood and affect. Her behavior is normal.  Nursing note and vitals reviewed.    ED Treatments / Results  Labs (all labs ordered are listed, but only abnormal results are displayed) Labs Reviewed - No data to display   Radiology No results found.  Procedures .Marland KitchenIncision and Drainage Date/Time: 12/25/2016 1:25 PM Performed by: Ashley Murrain Authorized by: Ashley Murrain   Consent:    Consent obtained:  Verbal   Consent given by:  Patient   Risks discussed:  Incomplete drainage and pain   Alternatives discussed:  Alternative treatment Location:    Type:  Abscess   Location:  Upper extremity   Upper extremity location:  Arm   Arm location: right axilla. Pre-procedure details:    Skin preparation:  Betadine Anesthesia (see MAR for exact dosages):    Anesthesia method:  Local infiltration   Local anesthetic:  Lidocaine 1% w/o epi Procedure type:    Complexity:  Complex Procedure details:    Needle aspiration: no     Incision types:  Single straight   Incision depth:  Dermal   Scalpel blade:  11   Wound management:  Probed and deloculated   Drainage:  Purulent and bloody   Drainage amount:  Moderate   Packing materials:  1/2 in iodoform gauze Post-procedure details:    Patient tolerance of procedure:  Tolerated well, no immediate complications   (including critical care time)  Medications Ordered in ED Medications  oxyCODONE-acetaminophen (PERCOCET/ROXICET) 5-325 MG per tablet 1 tablet (1 tablet Oral Given 12/25/16 1240)  lidocaine (XYLOCAINE) 2 % jelly 1 application (1 application Topical Given 12/25/16 1245)  lidocaine (PF) (XYLOCAINE) 1 % injection 5 mL (5 mLs Infiltration Given 12/25/16 1246)     Initial Impression /  Assessment and Plan / ED Course  I have reviewed the triage vital signs and the nursing notes. 46 y.o. female with abscess to the right axilla. Abscess drained and will start antibiotics and patient will follow up in 2 days for packing removal. Stable for d/c without other complaints at this time.   Final Clinical Impressions(s) / ED Diagnoses   Final diagnoses:  Abscess    New Prescriptions New Prescriptions   CEPHALEXIN (KEFLEX) 500 MG CAPSULE    Take 1 capsule (500 mg total) by mouth 4 (four) times daily.   NAPROXEN (NAPROSYN) 500 MG TABLET    Take 1 tablet (500 mg total) by mouth 2 (two) times daily.  SULFAMETHOXAZOLE-TRIMETHOPRIM (BACTRIM DS,SEPTRA DS) 800-160 MG TABLET    Take 1 tablet by mouth 2 (two) times daily.     Debroah Baller [place], [place] 12/25/16 1331    Forde Dandy, MD 12/25/16 [phone]

## 2016-12-25 NOTE — Care Management Note (Signed)
Case Management Note  Placed PCP options for pt to call and schedule an appointment for follow up on AVS for time of D/C. Included information on AVS for pharmacy and financial counseling needs.

## 2017-01-03 ENCOUNTER — Ambulatory Visit (INDEPENDENT_AMBULATORY_CARE_PROVIDER_SITE_OTHER): Payer: Self-pay | Admitting: Surgery

## 2017-01-17 ENCOUNTER — Ambulatory Visit (INDEPENDENT_AMBULATORY_CARE_PROVIDER_SITE_OTHER): Payer: Self-pay | Admitting: Surgery

## 2017-01-30 ENCOUNTER — Encounter (INDEPENDENT_AMBULATORY_CARE_PROVIDER_SITE_OTHER): Payer: Self-pay | Admitting: Surgery

## 2017-01-30 ENCOUNTER — Ambulatory Visit (INDEPENDENT_AMBULATORY_CARE_PROVIDER_SITE_OTHER): Payer: Self-pay | Admitting: Surgery

## 2017-01-30 ENCOUNTER — Ambulatory Visit (INDEPENDENT_AMBULATORY_CARE_PROVIDER_SITE_OTHER): Payer: Self-pay

## 2017-01-30 DIAGNOSIS — M25572 Pain in left ankle and joints of left foot: Secondary | ICD-10-CM

## 2017-01-30 NOTE — Progress Notes (Signed)
Office Visit Note   Patient: [name]           Date of Birth: [birth year]           MRN: [id number] Visit Date: 01/30/2017              Requested by: No referring provider defined for this encounter. PCP: Patient, No Pcp Per   Assessment & Plan: Visit Diagnoses:  1. Pain in left ankle and joints of left foot     Plan: Patient seen with Dr. Lorin Mercy today. She was given a note to return back to work next Monday. Recommend that she use an Aircast that was given today and wean out of this over the next few weeks. Do range of motion exercises.  Follow-up in 6 weeks for recheck. I did advise patient to call me in 3 weeks to let me know how she is feeling. If pain worsens I will plan to schedule an MRI left ankle to rule out any soft tissue injury and nonunion.  Follow-Up Instructions: Return in about 6 weeks (around 03/13/2017).   Orders:  Orders Placed This Encounter  Procedures  . XR Ankle Complete Left   No orders of the defined types were placed in this encounter.     Procedures: No procedures performed   Clinical Data: No additional findings.   Subjective: Chief Complaint  Patient presents with  . Left Ankle - Pain, Follow-up    HPI Patient returns for recheck of left distal fibula fracture. Injury August 2018. She has discontinued wearing her boot since her last visit. She continues to complain of pain over the fracture site along with swelling.  No new injury. She wants to go back to work. Review of Systems No current cardiac pulmonary GI GU issues  Objective: Vital Signs: There were no vitals taken for this visit.  Physical Exam  Constitutional: She is oriented to person, place, and time. She appears well-developed.  HENT:  Head: Normocephalic and atraumatic.  Eyes: EOM are normal. Pupils are equal, round, and reactive to light.  Pulmonary/Chest: No respiratory distress.  Musculoskeletal:  She still moderate to markedly tender over the distal fibula.  She has some swelling although not extreme. Ankle range of motion improving. Calf nontender. Neurovascular intact.  Neurological: She is alert and oriented to person, place, and time.  Skin: Skin is warm and dry.    Ortho Exam  Specialty Comments:  No specialty comments available.  Imaging: Xr Ankle Complete Left  Result Date: 01/30/2017 X-rays reviewed with Dr. Lorin Mercy. He states that the distal fibula fracture is healed.      PMFS History: Patient Active Problem List   Diagnosis Date Noted  . Mastitis 10/02/2013  . Left breast abscess 04/12/2011   Past Medical History:  Diagnosis Date  . Abscess   . Ankle fracture   . History of breast abscess 2013  . Rheumatic fever     Family History  Problem Relation Age of Onset  . Heart disease Paternal Grandmother     Past Surgical History:  Procedure Laterality Date  . BREAST SURGERY    . INCISE AND DRAIN ABCESS     left breast abscess  . Incision & drainage left chest     left chest  . scar tissue removed     Social History   Occupational History  . Not on file  Tobacco Use  . Smoking status: Current Every Day Smoker    Packs/day: 0.50  Years: 28.00    Pack years: 14.00    Types: Cigarettes  . Smokeless tobacco: Never Used  Substance and Sexual Activity  . Alcohol use: Yes    Comment: 1/2 bottle wine on weekends  . Drug use: No  . Sexual activity: No

## 2017-02-21 ENCOUNTER — Ambulatory Visit (INDEPENDENT_AMBULATORY_CARE_PROVIDER_SITE_OTHER): Payer: Self-pay | Admitting: Surgery

## 2017-05-27 ENCOUNTER — Encounter (HOSPITAL_COMMUNITY): Payer: Self-pay | Admitting: Emergency Medicine

## 2017-05-27 ENCOUNTER — Emergency Department (HOSPITAL_COMMUNITY)
Admission: EM | Admit: 2017-05-27 | Discharge: 2017-05-27 | Disposition: A | Discharge status: Home / Self Care | Payer: Self-pay | Attending: Emergency Medicine | Admitting: Emergency Medicine

## 2017-05-27 DIAGNOSIS — F17210 Nicotine dependence, cigarettes, uncomplicated: Secondary | ICD-10-CM | POA: Insufficient documentation

## 2017-05-27 DIAGNOSIS — N764 Abscess of vulva: Secondary | ICD-10-CM | POA: Insufficient documentation

## 2017-05-27 MED ORDER — IBUPROFEN 200 MG PO TABS
600.0000 mg | ORAL_TABLET | Freq: Once | ORAL | Status: AC
  Administered 2017-05-27: 600 mg via ORAL
  Filled 2017-05-27: qty 3

## 2017-05-27 MED ORDER — DOXYCYCLINE HYCLATE 100 MG PO CAPS: 100.0000 mg | ORAL_CAPSULE | Freq: Two times a day (BID) | ORAL | 0 refills | Status: DC

## 2017-05-27 MED ORDER — LIDOCAINE HCL 2 % IJ SOLN
20.0000 mL | Freq: Once | INTRAMUSCULAR | Status: AC
  Administered 2017-05-27: 10 mL
  Filled 2017-05-27: qty 20

## 2017-05-27 MED ORDER — OXYCODONE-ACETAMINOPHEN 5-325 MG PO TABS: 1.0000 | ORAL_TABLET | Freq: Three times a day (TID) | ORAL | 0 refills | Status: DC | PRN

## 2017-05-27 MED ORDER — OXYCODONE-ACETAMINOPHEN 5-325 MG PO TABS: 1.0000 | ORAL_TABLET | Freq: Once | ORAL | Status: DC

## 2017-05-27 NOTE — ED Provider Notes (Signed)
[facility] DEPT Provider Note   CSN: [id number] Arrival date & time: 05/27/17  0815     History   Chief Complaint Chief Complaint  Patient presents with  . Abscess  . Foreign Body in [place] is a 47 y.o. female to the emergency department with multiple complaints.  She presents with abscess to the left labia that started off as a small "knot"several weeks ago, but over the last 2 days has become very large and painful. Pain is worse with sitting up straight and ambulation. and improved with laying flat and rest.  She denies fever chills.  She treated her pain with 1 dose of ibuprofen 2 days ago without improvement.  No other treatment prior to arrival.   The patient reports that 2 weeks ago she was cleaning out her left ear with a cotton swab when a piece of the cotton came off of the tip and lodged in the back of her ear canal.  She reports that she try to flush out the ear, but did not see the piece of cotton come out.  No h/o of DM.   The history is provided by the patient. No language interpreter was used.  Abscess  Location:  Pelvis Size:  Left labia  Abscess quality: fluctuance, induration, painful and redness   Abscess quality: not draining and not weeping   Red streaking: no   Duration:  2 weeks Progression:  Worsening Pain details:    Quality:  Throbbing and shooting   Severity:  Moderate   Duration:  2 days   Timing:  Constant   Progression:  Worsening Chronicity:  New Context: not diabetes and not immunosuppression   Relieved by:  Nothing Worsened by:  Nothing Ineffective treatments: Acetaminophen. Associated symptoms: no fever, no nausea and no vomiting   Risk factors: prior abscess     Past Medical History:  Diagnosis Date  . Abscess   . Ankle fracture   . History of breast abscess 2013  . Rheumatic fever     Patient Active Problem List   Diagnosis Date Noted  . Mastitis 10/02/2013  . Left  breast abscess 04/12/2011    Past Surgical History:  Procedure Laterality Date  . BREAST SURGERY    . INCISE AND DRAIN ABCESS     left breast abscess  . Incision & drainage left chest     left chest  . INCISION AND DRAINAGE OF WOUND N/A 09/06/2014   Procedure: IRRIGATION AND DEBRIDEMENT LEFT BREAST ABCESS;  Surgeon: Jackolyn Confer, MD;  Location: [facility];  Service: General;  Laterality: N/A;  . IRRIGATION AND DEBRIDEMENT ABSCESS Left 10/05/2013   Procedure: IRRIGATION AND DEBRIDEMENT left breast ABSCESS;  Surgeon: Merrie Roof, MD;  Location: [facility];  Service: General;  Laterality: Left;  . scar tissue removed      OB History    No data available       Home Medications    Prior to Admission medications   Medication Sig Start Date End Date Taking? Authorizing Provider  doxycycline (VIBRAMYCIN) 100 MG capsule Take 1 capsule (100 mg total) by mouth 2 (two) times daily. 05/27/17   Jadier Rockers A, PA-C  oxyCODONE-acetaminophen (ROXICET) 5-325 MG tablet Take 1 tablet by mouth every 8 (eight) hours as needed for severe pain. 05/27/17   Shammara Jarrett A, PA-C    Family History Family History  Problem Relation Age of Onset  . Heart disease Paternal Grandmother  Social History Social History   Tobacco Use  . Smoking status: Current Every Day Smoker    Packs/day: 0.50    Years: 28.00    Pack years: 14.00    Types: Cigarettes  . Smokeless tobacco: Never Used  Substance Use Topics  . Alcohol use: Yes    Comment: 1/2 bottle wine on weekends  . Drug use: No     Allergies   Hydrocodone   Review of Systems Review of Systems  Constitutional: Negative for chills and fever.  HENT: Positive for ear pain. Negative for ear discharge.   Gastrointestinal: Negative for nausea and vomiting.  Genitourinary: Positive for genital sores and pelvic pain.  Skin: Positive for color change and wound. Negative for rash.  Allergic/Immunologic: Negative for immunocompromised state.      Physical Exam Updated Vital Signs BP (!) 127/111   Pulse 81   Temp 98.1 F (36.7 C) (Oral)   Resp 18   SpO2 100%   Physical Exam  Constitutional: No distress.  HENT:  Head: Normocephalic.  Bilateral canals are clear with no evidence of foreign body.  Eyes: Conjunctivae are normal.  Neck: Neck supple.  Cardiovascular: Normal rate and regular rhythm. Exam reveals no gallop and no friction rub.  No murmur heard. Pulmonary/Chest: Effort normal. No stridor. No respiratory distress. She has no wheezes. She has no rales. She exhibits no tenderness.  Abdominal: Soft. She exhibits no distension and no mass. There is no tenderness. There is no rebound and no guarding. No hernia.  Genitourinary:  Genitourinary Comments: Marked induration with moderate fluctuance noted to the left labia with overlying erythema and mild warmth.  No rectal involvement.  Neurological: She is alert.  Skin: Skin is warm. Capillary refill takes less than 2 seconds. No rash noted.  Psychiatric: Her behavior is normal.  Nursing note and vitals reviewed.    ED Treatments / Results  Labs (all labs ordered are listed, but only abnormal results are displayed) Labs Reviewed - No data to display  EKG  EKG Interpretation None       Radiology No results found.  Procedures  EMERGENCY DEPARTMENT US SOFT TISSUE INTERPRETATION "Study: Limited Soft Tissue Ultrasound"  INDICATIONS: Pain Multiple views of the body part were obtained in real-time with a multi-frequency linear probe  PERFORMED BY: Myself IMAGES ARCHIVED?: Yes SIDE:Left BODY PART:labia INTERPRETATION:  Abcess present     .Marland KitchenIncision and Drainage Date/Time: 05/27/2017 11:51 AM Performed by: Joanne Gavel, PA-C Authorized by: Joanne Gavel, PA-C   Consent:    Consent obtained:  Verbal   Consent given by:  Patient   Risks discussed:  Bleeding, incomplete drainage, pain, infection and damage to other organs   Alternatives  discussed:  Referral and no treatment Location:    Type:  Abscess   Location:  Anogenital   Anogenital location: left labia. Pre-procedure details:    Skin preparation:  Betadine Anesthesia (see MAR for exact dosages):    Anesthesia method:  Local infiltration   Local anesthetic:  Lidocaine 2% w/o epi Procedure type:    Complexity:  Complex Procedure details:    Incision types:  Single straight   Incision depth:  Dermal   Scalpel blade:  10   Wound management:  Probed and deloculated and extensive cleaning   Drainage:  Bloody   Drainage amount:  Moderate   Wound treatment:  Wound left open   Packing materials:  1/4 in gauze Post-procedure details:    Patient tolerance of procedure:  Tolerated well, no immediate complications   (including critical care time)  Medications Ordered in ED Medications  lidocaine (XYLOCAINE) 2 % (with pres) injection 400 mg (10 mLs Infiltration Given 05/27/17 1158)  ibuprofen (ADVIL,MOTRIN) tablet 600 mg (600 mg Oral Given 05/27/17 0909)     Initial Impression / Assessment and Plan / ED Course  I have reviewed the triage vital signs and the nursing notes.  Pertinent labs & imaging results that were available during my care of the patient were reviewed by me and considered in my medical decision making (see chart for details).     47 year old female sending with foreign body in the left ear and abscess. Afebrile and vital signs are otherwise unremarkable.  On physical exam, left ear canal without foreign body and is otherwise unremarkable.  The left labia is very indurated with moderate fluctuance.  No surrounding cellulitis.  On bedside ultrasound, a fluid collection large enough for I&D is present.  Discussed at length with the patient that there was a considerable amount of induration that may require a second I&D in the future if her symptoms did not resolve. She acknowledged and was agreeable to the risks and benefits of I&D.  Patient with skin  abscess amenable to incision and drainage.  Tylenol and Percocet given for pain control.  After the area was anesthetized with lidocaine, an initial incision was made with moderate amount of bloody discharge. Ward catheter was initially successfully place, but after the procedure was completed would not stay in place. The initial incision was packed with 1/4 idodoform gauze. Another second fluid collection was noted on [place] that appeared to be separate from the first.  A second area was anesthetized and incision was made, which was also packed.  Encouraged home warm soaks and flushing.  Recommended wound recheck and 2-3 days.  I have given the patient a referral to OB/GYN and [facility] and wellness to get established with a PCP.  Strict return precautions given.  Will d/c to home with oral doxycycline given recurrent abscesses and remaining amount of induration after the area was drained and pain control. A 75-month prescription history query was performed using the [place] CSRS prior to discharge.  She is hemodynamically stable and in no acute distress prior to discharge.   Final Clinical Impressions(s) / ED Diagnoses   Final diagnoses:  Left genital labial abscess    ED Discharge Orders        Ordered    oxyCODONE-acetaminophen (ROXICET) 5-325 MG tablet  Every 8 hours PRN     05/27/17 1141    doxycycline (VIBRAMYCIN) 100 MG capsule  2 times daily     05/27/17 1141       Kamarri Lovvorn, Laymond Purser, PA-C 05/27/17 Maudie Flakes, MD 05/31/17 2215

## 2017-05-27 NOTE — ED Triage Notes (Signed)
Patient c/o vaginal abscess that has gotten larger and more painful over past several days. Also had cotton in left ear over 2 weeks ago and forgot about it.

## 2017-05-27 NOTE — Discharge Instructions (Signed)
For mild to moderate pain, take 650 mg of Tylenol or 600 mg of ibuprofen with food every 6 hours.  For severe pain, may take 1 tablet of Percocet once every 8 hours as needed.  Apply warm compresses to the area for 15-20 minutes several times throughout the day.  You can also take 1-2 sitz bath per day to help with your symptoms.  The packing in the wound should be removed within the next week.  You can call and schedule follow-up appointment with current health and wellness or get established with an OB/GYN for follow-up.   Take 1 tablet of doxycycline twice daily for the next 10 days.   If the area becomes red, hot, or swollen, particularly after he been on antibiotics for 2-3 days, if you develop fever chills, or other new concerning symptoms, please return to the emergency department for re-evaluation.

## 2017-05-28 MED FILL — DOXYCYCLINE HYCLATE 100 MG: 100 | 10 days supply | Qty: 20 | Fill #0

## 2017-05-28 MED FILL — OXYCOD/ACETAMINOPHEN 5-325M: 5-325 | 4 days supply | Qty: 10 | Fill #0

## 2017-09-07 ENCOUNTER — Encounter (HOSPITAL_COMMUNITY): Payer: Self-pay | Admitting: Emergency Medicine

## 2017-09-07 ENCOUNTER — Emergency Department (HOSPITAL_COMMUNITY)
Admission: EM | Admit: 2017-09-07 | Discharge: 2017-09-08 | Disposition: A | Discharge status: Home / Self Care | Payer: Self-pay | Attending: Emergency Medicine | Admitting: Emergency Medicine

## 2017-09-07 ENCOUNTER — Other Ambulatory Visit: Payer: Self-pay

## 2017-09-07 DIAGNOSIS — H6001 Abscess of right external ear: Secondary | ICD-10-CM | POA: Insufficient documentation

## 2017-09-07 DIAGNOSIS — J029 Acute pharyngitis, unspecified: Secondary | ICD-10-CM | POA: Insufficient documentation

## 2017-09-07 NOTE — ED Triage Notes (Signed)
Pt arriving with abscess on left ear and sore throat. Pt having difficulty swallowing. Pt reports using throat spray with no relief. Left tonsil is swollen.

## 2017-09-08 LAB — GROUP A STREP BY PCR: GROUP A STREP BY PCR: NOT DETECTED

## 2017-09-08 MED ORDER — LIDOCAINE-EPINEPHRINE (PF) 2 %-1:200000 IJ SOLN
10.0000 mL | Freq: Once | INTRAMUSCULAR | Status: DC
  Filled 2017-09-08: qty 20

## 2017-09-08 MED ORDER — ACETAMINOPHEN 325 MG PO TABS
650.0000 mg | ORAL_TABLET | Freq: Once | ORAL | Status: AC
  Administered 2017-09-08: 650 mg via ORAL
  Filled 2017-09-08: qty 2

## 2017-09-08 MED ORDER — CEPHALEXIN 500 MG PO CAPS: 500.0000 mg | ORAL_CAPSULE | Freq: Three times a day (TID) | ORAL | 0 refills | Status: DC

## 2017-09-08 NOTE — ED Notes (Signed)
Discharge instructions reviewed with pt. Pt verbalized understanding. Pt wound dressed with gauze and tape. Pt ambulatory to waiting room with husband.

## 2017-09-08 NOTE — Discharge Instructions (Addendum)
Take Tylenol or Motrin for pain.  Salt water gargles for your sore throat several times a day.  Warm compresses to the earlobe.  Take antibiotic as prescribed until all gone.  Follow-up with family doctor next week.

## 2017-09-08 NOTE — ED Provider Notes (Signed)
[facility] DEPT Provider Note   CSN: [id number] Arrival date & time: 09/07/17  2220     History   Chief Complaint Chief Complaint  Patient presents with  . Sore Throat  . Abscess    left ear    HPI [name] is a 47 y.o. female.  HPI  [name] is a 47 y.o. female presents to emergency department with 2 separate complaints.  Patient states she has had a pimple on her right ear over the last week, states it gradually has gotten worse and larger in size.  There is no drainage.  She states very tender to palpation.  She has not tried any treatments prior to coming in.  She is also complaining of sore throat for last 2 days.  She states her tonsils have always been large, however they normally do not hurt.  She states is painful for her to swallow.  No fever or chills.  No nausea or vomiting.  No cough or congestion.  Again no treatment prior to arrival.  No sick contacts.   Past Medical History:  Diagnosis Date  . Abscess   . Ankle fracture   . History of breast abscess 2013  . Rheumatic fever     Patient Active Problem List   Diagnosis Date Noted  . Mastitis 10/02/2013  . Left breast abscess 04/12/2011    Past Surgical History:  Procedure Laterality Date  . BREAST SURGERY    . INCISE AND DRAIN ABCESS     left breast abscess  . Incision & drainage left chest     left chest  . INCISION AND DRAINAGE OF WOUND N/A 09/06/2014   Procedure: IRRIGATION AND DEBRIDEMENT LEFT BREAST ABCESS;  Surgeon: Jackolyn Confer, MD;  Location: [facility];  Service: General;  Laterality: N/A;  . IRRIGATION AND DEBRIDEMENT ABSCESS Left 10/05/2013   Procedure: IRRIGATION AND DEBRIDEMENT left breast ABSCESS;  Surgeon: Merrie Roof, MD;  Location: [facility];  Service: General;  Laterality: Left;  . scar tissue removed       OB History   None      Home Medications    Prior to Admission medications   Medication Sig Start Date End Date Taking?  Authorizing Provider  doxycycline (VIBRAMYCIN) 100 MG capsule Take 1 capsule (100 mg total) by mouth 2 (two) times daily. 05/27/17   McDonald, Mia A, PA-C  oxyCODONE-acetaminophen (ROXICET) 5-325 MG tablet Take 1 tablet by mouth every 8 (eight) hours as needed for severe pain. 05/27/17   McDonald, Mia A, PA-C    Family History Family History  Problem Relation Age of Onset  . Heart disease Paternal Grandmother     Social History Social History   Tobacco Use  . Smoking status: Current Every Day Smoker    Packs/day: 0.50    Years: 28.00    Pack years: 14.00    Types: Cigarettes  . Smokeless tobacco: Never Used  Substance Use Topics  . Alcohol use: Yes    Comment: 1/2 bottle wine on weekends  . Drug use: No     Allergies   Hydrocodone   Review of Systems Review of Systems  Constitutional: Negative for chills and fever.  HENT: Positive for sore throat. Negative for congestion, trouble swallowing and voice change.   Respiratory: Negative for cough, chest tightness and shortness of breath.   Cardiovascular: Negative for chest pain, palpitations and leg swelling.  Genitourinary: Negative for dysuria and flank pain.  Musculoskeletal: Negative for  arthralgias, myalgias, neck pain and neck stiffness.  Skin: Positive for wound. Negative for rash.  Neurological: Negative for dizziness and headaches.  All other systems reviewed and are negative.    Physical Exam Updated Vital Signs BP 131/81 (BP Location: Left Arm)   Pulse 86   Temp 98.6 F (37 C) (Oral)   Resp 16   Ht 5\' 6"  (1.676 m)   Wt 76.2 kg (168 lb)   SpO2 99%   BMI 27.12 kg/m   Physical Exam  Constitutional: She appears well-developed and well-nourished. No distress.  HENT:  Oropharynx erythematous, tonsils are bilaterally enlarged.  Uvula is midline.  Swallowing no difficulty.  TMs are normal bilaterally.  There is a 1 x 1 cm area of fluctuance, erythema, central pustule to the right earlobe, right at the  attachment to the neck.  Tender to palpation.  No drainage.  Eyes: Conjunctivae are normal.  Neck: Neck supple.  Neurological: She is alert.  Skin: Skin is warm and dry.  Nursing note and vitals reviewed.    ED Treatments / Results  Labs (all labs ordered are listed, but only abnormal results are displayed) Labs Reviewed  GROUP A STREP BY PCR    EKG None  Radiology No results found.  Procedures .Marland KitchenIncision and Drainage Date/Time: 09/08/2017 2:25 AM Performed by: Jeannett Senior, PA-C Authorized by: Jeannett Senior, PA-C   Consent:    Consent obtained:  Verbal   Consent given by:  Patient   Risks discussed:  Bleeding, incomplete drainage and pain Location:    Type:  Abscess Pre-procedure details:    Skin preparation:  Betadine Anesthesia (see MAR for exact dosages):    Anesthesia method:  Local infiltration   Local anesthetic:  Lidocaine 2% WITH epi Procedure type:    Complexity:  Simple Procedure details:    Incision types:  Single straight   Incision depth:  Dermal   Scalpel blade:  11   Wound management:  Probed and deloculated and irrigated with saline   Drainage:  Purulent   Drainage amount:  Moderate   Wound treatment:  Wound left open Post-procedure details:    Patient tolerance of procedure:  Tolerated well, no immediate complications   (including critical care time)  Medications Ordered in ED Medications  lidocaine-EPINEPHrine (XYLOCAINE W/EPI) 2 %-1:200000 (PF) injection 10 mL (has no administration in time range)     Initial Impression / Assessment and Plan / ED Course  I have reviewed the triage vital signs and the nursing notes.  Pertinent labs & imaging results that were available during my care of the patient were reviewed by me and considered in my medical decision making (see chart for details).     She twisted her throat and to the left earlobe.  Abscess drained.  She has had similar abscess to the other ear, and I am wondering  if she has sinuses that have gotten infected, and possibly sebaceous cyst.  She tolerated I&D well.  Will start antibiotic for mild surrounding redness, possibly cellulitis.  Her strep screen is negative.  Most consistent with viral pharyngitis.  No concern for peritonsillar or retropharyngeal abscess.  She is swallowing with no difficulty.  Uvula is midline.  She is afebrile.  Advised to do salt water gargle, Tylenol, Motrin, follow-up with primary care doctor as needed.  Vitals:   09/07/17 2240  BP: 131/81  Pulse: 86  Resp: 16  Temp: 98.6 F (37 C)  TempSrc: Oral  SpO2: 99%  Weight: 76.2 kg (  168 lb)  Height: 5\' 6"  (1.676 m)     Final Clinical Impressions(s) / ED Diagnoses   Final diagnoses:  Pharyngitis, unspecified etiology  Abscess, earlobe, right    ED Discharge Orders        Ordered    cephALEXin (KEFLEX) 500 MG capsule  3 times daily     09/08/17 0227       Jeannett Senior, PA-C 09/08/17 0228    Fatima Blank, MD 09/08/17 [phone]

## 2017-10-09 ENCOUNTER — Other Ambulatory Visit: Payer: Self-pay

## 2017-10-09 ENCOUNTER — Encounter (HOSPITAL_COMMUNITY): Payer: Self-pay | Admitting: *Deleted

## 2017-10-09 ENCOUNTER — Emergency Department (HOSPITAL_COMMUNITY): Payer: 59

## 2017-10-09 ENCOUNTER — Emergency Department (HOSPITAL_COMMUNITY)
Admission: EM | Admit: 2017-10-09 | Discharge: 2017-10-09 | Disposition: A | Discharge status: Home / Self Care | Payer: 59 | Attending: Emergency Medicine | Admitting: Emergency Medicine

## 2017-10-09 DIAGNOSIS — H9202 Otalgia, left ear: Secondary | ICD-10-CM | POA: Diagnosis present

## 2017-10-09 DIAGNOSIS — F17210 Nicotine dependence, cigarettes, uncomplicated: Secondary | ICD-10-CM | POA: Diagnosis not present

## 2017-10-09 DIAGNOSIS — H6002 Abscess of left external ear: Secondary | ICD-10-CM | POA: Diagnosis not present

## 2017-10-09 LAB — CBC WITH DIFFERENTIAL/PLATELET
BASOS ABS: 0 10*3/uL (ref 0.0–0.1)
BASOS PCT: 0 %
EOS PCT: 2 %
Eosinophils Absolute: 0.2 10*3/uL (ref 0.0–0.7)
HEMATOCRIT: 30.2 % — AB (ref 36.0–46.0)
Hemoglobin: 9.9 g/dL — ABNORMAL LOW (ref 12.0–15.0)
LYMPHS PCT: 30 %
Lymphs Abs: 2.5 10*3/uL (ref 0.7–4.0)
MCH: 29.4 pg (ref 26.0–34.0)
MCHC: 32.8 g/dL (ref 30.0–36.0)
MCV: 89.6 fL (ref 78.0–100.0)
Monocytes Absolute: 0.6 10*3/uL (ref 0.1–1.0)
Monocytes Relative: 7 %
NEUTROS ABS: 5.2 10*3/uL (ref 1.7–7.7)
Neutrophils Relative %: 61 %
Platelets: 289 10*3/uL (ref 150–400)
RBC: 3.37 MIL/uL — AB (ref 3.87–5.11)
RDW: 15.7 % — ABNORMAL HIGH (ref 11.5–15.5)
WBC: 8.6 10*3/uL (ref 4.0–10.5)

## 2017-10-09 LAB — BASIC METABOLIC PANEL
Anion gap: 7 (ref 5–15)
BUN: 12 mg/dL (ref 6–20)
CALCIUM: 9.1 mg/dL (ref 8.9–10.3)
CO2: 26 mmol/L (ref 22–32)
Chloride: 113 mmol/L — ABNORMAL HIGH (ref 98–111)
Creatinine, Ser: 0.83 mg/dL (ref 0.44–1.00)
GFR calc Af Amer: >60 mL/min (ref >60)
Glucose, Bld: 102 mg/dL — ABNORMAL HIGH (ref 70–99)
POTASSIUM: 3.8 mmol/L (ref 3.5–5.1)
SODIUM: 146 mmol/L — AB (ref 135–145)

## 2017-10-09 MED ORDER — ONDANSETRON 4 MG PO TBDP
4.0000 mg | ORAL_TABLET | Freq: Once | ORAL | Status: AC
  Administered 2017-10-09: 4 mg via ORAL
  Filled 2017-10-09: qty 1

## 2017-10-09 MED ORDER — CLINDAMYCIN PHOSPHATE 300 MG/50ML IV SOLN
300.0000 mg | Freq: Once | INTRAVENOUS | Status: AC
  Administered 2017-10-09: 300 mg via INTRAVENOUS
  Filled 2017-10-09: qty 50

## 2017-10-09 MED ORDER — IOHEXOL 300 MG/ML  SOLN
75.0000 mL | Freq: Once | INTRAMUSCULAR | Status: AC | PRN
  Administered 2017-10-09: 75 mL via INTRAVENOUS

## 2017-10-09 MED ORDER — MORPHINE SULFATE (PF) 4 MG/ML IV SOLN
4.0000 mg | Freq: Once | INTRAVENOUS | Status: AC
  Administered 2017-10-09: 4 mg via INTRAVENOUS
  Filled 2017-10-09: qty 1

## 2017-10-09 MED ORDER — CLINDAMYCIN HCL 300 MG PO CAPS: 300.0000 mg | ORAL_CAPSULE | Freq: Four times a day (QID) | ORAL | 0 refills | Status: AC

## 2017-10-09 NOTE — ED Provider Notes (Signed)
[facility] DEPT Provider Note  CSN: [id number] Arrival date & time: 10/09/17  1712  History   Chief Complaint Chief Complaint  Patient presents with  . Abscess    left ear    HPI [name] is a 47 y.o. female with a medical history of abscess and cysts who presented to the ED for ear lobe abscess x 1 week. Patient states that she cut her ear on an earring when removing it and after that her earlobe became red and swollen. Then it evolved to forming a whitehead and she states it feels like other abscess she has had. Endorses tenderness to palpation. Denies fever, neck stiffness, jaw pain or trismus.  Additional history obtained by medical chart. Patient has been to the ED 7 times since 2018 for abscesses or cysts. She has trialed Augmentin and Doxycycline for associated cellulitis.  Past Medical History:  Diagnosis Date  . Abscess   . Ankle fracture   . History of breast abscess 2013  . Rheumatic fever     Patient Active Problem List   Diagnosis Date Noted  . Mastitis 10/02/2013  . Left breast abscess 04/12/2011    Past Surgical History:  Procedure Laterality Date  . BREAST SURGERY    . INCISE AND DRAIN ABCESS     left breast abscess  . Incision & drainage left chest     left chest  . INCISION AND DRAINAGE OF WOUND N/A 09/06/2014   Procedure: IRRIGATION AND DEBRIDEMENT LEFT BREAST ABCESS;  Surgeon: Jackolyn Confer, MD;  Location: [facility];  Service: General;  Laterality: N/A;  . IRRIGATION AND DEBRIDEMENT ABSCESS Left 10/05/2013   Procedure: IRRIGATION AND DEBRIDEMENT left breast ABSCESS;  Surgeon: Merrie Roof, MD;  Location: [facility];  Service: General;  Laterality: Left;  . scar tissue removed       OB History   None      Home Medications    Prior to Admission medications   Medication Sig Start Date End Date Taking? Authorizing Provider  ibuprofen (ADVIL,MOTRIN) 200 MG tablet Take 400 mg by mouth daily as needed for moderate  pain.   Yes [provider]  cephALEXin (KEFLEX) 500 MG capsule Take 1 capsule (500 mg total) by mouth 3 (three) times daily. Patient not taking: Reported on 10/09/2017 09/08/17   Jeannett Senior, PA-C  clindamycin (CLEOCIN) 300 MG capsule Take 1 capsule (300 mg total) by mouth 4 (four) times daily for 7 days. 10/09/17 10/16/17  Mick Tanguma, Alvie Heidelberg I, PA-C  doxycycline (VIBRAMYCIN) 100 MG capsule Take 1 capsule (100 mg total) by mouth 2 (two) times daily. Patient not taking: Reported on 10/09/2017 05/27/17   McDonald, Maree Erie A, PA-C  oxyCODONE-acetaminophen (ROXICET) 5-325 MG tablet Take 1 tablet by mouth every 8 (eight) hours as needed for severe pain. Patient not taking: Reported on 10/09/2017 05/27/17   Joanne Gavel, PA-C    Family History Family History  Problem Relation Age of Onset  . Heart disease Paternal Grandmother     Social History Social History   Tobacco Use  . Smoking status: Current Every Day Smoker    Packs/day: 0.50    Years: 28.00    Pack years: 14.00    Types: Cigarettes  . Smokeless tobacco: Never Used  Substance Use Topics  . Alcohol use: Yes    Comment: 1/2 bottle wine on weekends  . Drug use: No     Allergies   Hydrocodone   Review of Systems Review of  Systems  Constitutional: Negative for chills and fever.  HENT: Positive for ear pain. Negative for ear discharge, facial swelling and trouble swallowing.   Eyes: Negative for pain and visual disturbance.  Respiratory: Negative.   Cardiovascular: Negative.   Gastrointestinal: Negative.   Endocrine: Negative.   Musculoskeletal: Negative for neck pain and neck stiffness.  Skin:       Abscess on ear lobe  Allergic/Immunologic: Negative for immunocompromised state.  Neurological: Negative for weakness, numbness and headaches.  Hematological: Negative.      Physical Exam Updated Vital Signs BP 119/76   Pulse 71   Temp 99.1 F (37.3 C) (Oral)   Resp 16   Ht 5\' 6"  (1.676 m)   Wt 73.5 kg  (162 lb)   SpO2 96%   BMI 26.15 kg/m   Physical Exam  Constitutional: Vital signs are normal. She appears well-developed and well-nourished.  HENT:  Head: Normocephalic.  Right Ear: Tympanic membrane, external ear and ear canal normal.  Left Ear: Tympanic membrane, external ear and ear canal normal. There is tenderness.  No middle ear effusion.  Ears:  Mouth/Throat: Uvula is midline, oropharynx is clear and moist and mucous membranes are normal.  ~ 1cm area of fluctuance on the anterior and posterior portion of left ear lobe. No surrounding erythema or streaking. Tender to palpation.  Eyes: Pupils are equal, round, and reactive to light. Conjunctivae, EOM and lids are normal.  Neck: Trachea normal, normal range of motion, full passive range of motion without pain and phonation normal. Neck supple. No neck rigidity. Normal range of motion present.  Cardiovascular: Normal rate, regular rhythm and normal heart sounds.  Pulmonary/Chest: Effort normal and breath sounds normal.  Neurological: No cranial nerve deficit.  Skin: Skin is warm and intact. No erythema.  Nursing note and vitals reviewed.    ED Treatments / Results  Labs (all labs ordered are listed, but only abnormal results are displayed) Labs Reviewed  BASIC METABOLIC PANEL - Abnormal; Notable for the following components:      Result Value   Sodium 146 (*)    Chloride 113 (*)    Glucose, Bld 102 (*)    All other components within normal limits  CBC WITH DIFFERENTIAL/PLATELET - Abnormal; Notable for the following components:   RBC 3.37 (*)    Hemoglobin 9.9 (*)    HCT 30.2 (*)    RDW 15.7 (*)    All other components within normal limits    EKG None  Radiology Ct Soft Tissue Neck W Contrast  Result Date: 10/09/2017 CLINICAL DATA:  LEFT ear abscess for 6 days, possibly related to jewelry. EXAM: CT NECK WITH CONTRAST TECHNIQUE: Multidetector CT imaging of the neck was performed using the standard protocol following  the bolus administration of intravenous contrast. CONTRAST:  39mL OMNIPAQUE IOHEXOL 300 MG/ML  SOLN COMPARISON:  CT HEAD and cervical spine January 09, 2011. FINDINGS: PHARYNX AND LARYNX: Normal.  Widely patent airway. SALIVARY GLANDS: Normal. THYROID: Normal. LYMPH NODES: No lymphadenopathy by CT size criteria. VASCULAR: Minimal calcific atherosclerosis RIGHT carotid bifurcation. LIMITED INTRACRANIAL: Normal. VISUALIZED ORBITS: Normal. MASTOIDS AND VISUALIZED PARANASAL SINUSES: Well-aerated. SKELETON: Nonacute. Mild lower cervical ventral endplate spurring. Moderately into dental osteoarthrosis. UPPER CHEST: Lung apices are clear. No superior mediastinal lymphadenopathy. OTHER: 10 x 13 x 15 mm rim enhancing fluid collection medial LEFT external ear lobule extending to retroauricular soft tissues without subcutaneous gas or radiopaque foreign bodies. No significant surrounding inflammatory changes. IMPRESSION: 1. 10 x 13 x 15  mm LEFT ear abscess. Electronically Signed   By: Elon Alas M.D.   On: 10/09/2017 21:33    Procedures .Marland KitchenIncision and Drainage Date/Time: 10/09/2017 10:24 PM Performed by: Romie Jumper, PA-C Authorized by: Romie Jumper, PA-C   Consent:    Consent obtained:  Verbal   Consent given by:  Patient   Risks discussed:  Bleeding, incomplete drainage, pain, infection and damage to other organs   Alternatives discussed:  No treatment Location:    Type:  Abscess   Size:  10x13x39mm   Location:  Head   Head location:  L external ear Pre-procedure details:    Skin preparation:  Betadine Anesthesia (see MAR for exact dosages):    Anesthesia method:  Local infiltration   Local anesthetic:  Lidocaine 1% w/o epi Procedure type:    Complexity:  Simple Procedure details:    Incision types:  Single straight   Incision depth:  Subcutaneous   Scalpel blade:  11   Wound management:  Probed and deloculated and irrigated with saline   Drainage:  Serosanguinous    Drainage amount:  Moderate   Wound treatment:  Wound left open   Packing materials:  None Post-procedure details:    Patient tolerance of procedure:  Tolerated well, no immediate complications   (including critical care time)  Medications Ordered in ED Medications  morphine 4 MG/ML injection 4 mg (4 mg Intravenous Given 10/09/17 1933)  ondansetron (ZOFRAN-ODT) disintegrating tablet 4 mg (4 mg Oral Given 10/09/17 1933)  iohexol (OMNIPAQUE) 300 MG/ML solution 75 mL (75 mLs Intravenous Contrast Given 10/09/17 2111)  clindamycin (CLEOCIN) IVPB 300 mg (0 mg Intravenous Stopped 10/09/17 2247)  morphine 4 MG/ML injection 4 mg (4 mg Intravenous Given 10/09/17 2210)  ondansetron (ZOFRAN-ODT) disintegrating tablet 4 mg (4 mg Oral Given 10/09/17 2212)     Initial Impression / Assessment and Plan / ED Course  Triage vital signs and the nursing notes have been reviewed.  Pertinent labs & imaging results that were available during care of the patient were reviewed and considered in medical decision making (see chart for details).  Patient presents with an abscess on her ear lobe. By the location of the abscess, it is difficult to appreciate how deep the abscess goes. CT of soft tissue/neck was ordered to properly evaluate the area. There is no surrounding erythema or abnormal findings of the other portions of the HENT exam which is reassuring.  Clinical Course as of Oct 10 1208  Wed Oct 09, 2017  2138 Left ear lobe abscess seen on CT of neck. No infiltration into neck or other soft tissue structures. Case discussed with Dr. Malvin Johns. Will perform I&D today and give IV Clindamycin 300mg  x1.   Patient will be discharged with PO Clindamycin.   [GM]    Clinical Course User Index [GM] Skylah Delauter, Jonelle Sports, PA-C    Final Clinical Impressions(s) / ED Diagnoses  1. Left Ear Abscess. Localized to ear lobe. I&D performed in the ED. IV Clindamycin x1 given in the ED. Rx for PO Clindamycin 300mg  QID x7  days prescribed. Education provided on wound care, follow-up and s/s of infection.  Dispo: Home. After thorough clinical evaluation, this patient is determined to be medically stable and can be safely discharged with the previously mentioned treatment and/or outpatient follow-up/referral(s). At this time, there are no other apparent medical conditions that require further screening, evaluation or treatment.   Final diagnoses:  Abscess of left external ear    ED Discharge  Orders        Ordered    clindamycin (CLEOCIN) 300 MG capsule  4 times daily     10/09/17 2200        Maury Dus I, PA-C 10/10/17 1210    Malvin Johns, MD 10/12/17 1204

## 2017-10-09 NOTE — ED Triage Notes (Addendum)
Pt to ed via POV with c/o of abscess on her Left ear lobe, she states could have possible started from wearing a cheap earring. Pt states her earring got caught and pulled and now has the abscess.The ear lobe has progressively gotten bigger over the past 6 days. Pt states pain is 9/10.

## 2017-10-09 NOTE — Discharge Instructions (Addendum)
I drained the abscess on your ear today. I have prescribed you a 7 day course of antibiotics. I decided with a longer dose since you have a history of getting abscesses frequently.  Follow-up with your primary care provider, local urgent care or return to the emergency department for a wound check in 1-2 days.  Follow-up with a medical provider sooner if you have one or more of the following symptoms: fever; increased redness, warmth or tenderness at the wound site; pain in joints beyond where the initial wound was; unusual discharge.  Thank you for your patience and for allowing me to take care of you today.

## 2017-10-18 ENCOUNTER — Other Ambulatory Visit: Payer: Self-pay

## 2017-10-18 ENCOUNTER — Emergency Department (HOSPITAL_COMMUNITY)
Admission: EM | Admit: 2017-10-18 | Discharge: 2017-10-18 | Disposition: A | Discharge status: Home / Self Care | Payer: 59 | Attending: Emergency Medicine | Admitting: Emergency Medicine

## 2017-10-18 ENCOUNTER — Emergency Department (HOSPITAL_COMMUNITY): Payer: 59

## 2017-10-18 ENCOUNTER — Encounter (HOSPITAL_COMMUNITY): Payer: Self-pay

## 2017-10-18 DIAGNOSIS — N939 Abnormal uterine and vaginal bleeding, unspecified: Secondary | ICD-10-CM | POA: Diagnosis not present

## 2017-10-18 DIAGNOSIS — D649 Anemia, unspecified: Secondary | ICD-10-CM | POA: Insufficient documentation

## 2017-10-18 DIAGNOSIS — D259 Leiomyoma of uterus, unspecified: Secondary | ICD-10-CM

## 2017-10-18 DIAGNOSIS — N390 Urinary tract infection, site not specified: Secondary | ICD-10-CM | POA: Insufficient documentation

## 2017-10-18 DIAGNOSIS — R102 Pelvic and perineal pain: Secondary | ICD-10-CM

## 2017-10-18 DIAGNOSIS — R1031 Right lower quadrant pain: Secondary | ICD-10-CM

## 2017-10-18 LAB — COMPREHENSIVE METABOLIC PANEL
ALK PHOS: 64 U/L (ref 38–126)
ALT: 12 U/L (ref 0–44)
AST: 14 U/L — AB (ref 15–41)
Albumin: 3.7 g/dL (ref 3.5–5.0)
Anion gap: 8 (ref 5–15)
BILIRUBIN TOTAL: 0.3 mg/dL (ref 0.3–1.2)
BUN: 8 mg/dL (ref 6–20)
CALCIUM: 8.7 mg/dL — AB (ref 8.9–10.3)
CO2: 23 mmol/L (ref 22–32)
CREATININE: 0.89 mg/dL (ref 0.44–1.00)
Chloride: 109 mmol/L (ref 98–111)
GFR calc Af Amer: >60 mL/min (ref >60)
GLUCOSE: 108 mg/dL — AB (ref 70–99)
Potassium: 3.7 mmol/L (ref 3.5–5.1)
Sodium: 140 mmol/L (ref 135–145)
TOTAL PROTEIN: 7.1 g/dL (ref 6.5–8.1)

## 2017-10-18 LAB — CBC WITH DIFFERENTIAL/PLATELET
BASOS ABS: 0 10*3/uL (ref 0.0–0.1)
Basophils Relative: 0 %
Eosinophils Absolute: 0.2 10*3/uL (ref 0.0–0.7)
Eosinophils Relative: 2 %
HEMATOCRIT: 29.6 % — AB (ref 36.0–46.0)
HEMOGLOBIN: 9.7 g/dL — AB (ref 12.0–15.0)
LYMPHS ABS: 2.4 10*3/uL (ref 0.7–4.0)
Lymphocytes Relative: 27 %
MCH: 29.2 pg (ref 26.0–34.0)
MCHC: 32.8 g/dL (ref 30.0–36.0)
MCV: 89.2 fL (ref 78.0–100.0)
MONOS PCT: 4 %
Monocytes Absolute: 0.4 10*3/uL (ref 0.1–1.0)
NEUTROS ABS: 5.7 10*3/uL (ref 1.7–7.7)
NEUTROS PCT: 67 %
Platelets: 292 10*3/uL (ref 150–400)
RBC: 3.32 MIL/uL — ABNORMAL LOW (ref 3.87–5.11)
RDW: 15.8 % — AB (ref 11.5–15.5)
WBC: 8.6 10*3/uL (ref 4.0–10.5)

## 2017-10-18 LAB — POC URINE PREG, ED: PREG TEST UR: NEGATIVE

## 2017-10-18 LAB — URINALYSIS, ROUTINE W REFLEX MICROSCOPIC
BILIRUBIN URINE: NEGATIVE
GLUCOSE, UA: NEGATIVE mg/dL
KETONES UR: NEGATIVE mg/dL
Nitrite: NEGATIVE
PROTEIN: 30 mg/dL — AB
Specific Gravity, Urine: 1.017 (ref 1.005–1.030)
pH: 5 (ref 5.0–8.0)

## 2017-10-18 LAB — URINALYSIS, MICROSCOPIC (REFLEX)

## 2017-10-18 LAB — WET PREP, GENITAL
Sperm: NONE SEEN
Trich, Wet Prep: NONE SEEN
Yeast Wet Prep HPF POC: NONE SEEN

## 2017-10-18 LAB — TYPE AND SCREEN
ABO/RH(D): A POS
Antibody Screen: NEGATIVE

## 2017-10-18 MED ORDER — SODIUM CHLORIDE 0.9 % IV BOLUS
1000.0000 mL | Freq: Once | INTRAVENOUS | Status: AC
  Administered 2017-10-18: 1000 mL via INTRAVENOUS

## 2017-10-18 MED ORDER — MORPHINE SULFATE (PF) 4 MG/ML IV SOLN
4.0000 mg | Freq: Once | INTRAVENOUS | Status: AC
  Administered 2017-10-18: 4 mg via INTRAVENOUS
  Filled 2017-10-18: qty 1

## 2017-10-18 MED ORDER — CEPHALEXIN 500 MG PO CAPS: ORAL_CAPSULE | ORAL | 0 refills | Status: DC

## 2017-10-18 MED ORDER — SODIUM CHLORIDE 0.9 % IV SOLN
1.0000 g | Freq: Once | INTRAVENOUS | Status: AC
  Administered 2017-10-18: 1 g via INTRAVENOUS
  Filled 2017-10-18: qty 10

## 2017-10-18 MED ORDER — MEFENAMIC ACID 250 MG PO CAPS: 500.0000 mg | ORAL_CAPSULE | Freq: Three times a day (TID) | ORAL | 0 refills | Status: DC

## 2017-10-18 NOTE — ED Triage Notes (Signed)
Pt states she has been menstruating for 17 days. Pt states that she started cramping yesterday, and got so bad around noon that she left work. Pt states that she took tylenol last night and this AM without relief. Pt states pain is mostly in her RLQ. Pt states she has been going through a pad an hour.

## 2017-10-18 NOTE — ED Provider Notes (Signed)
[facility] DEPT Provider Note   CSN: [id number] Arrival date & time: 10/18/17  1542     History   Chief Complaint Chief Complaint  Patient presents with  . Vaginal Bleeding    HPI [name] is a 47 y.o. female with a PMHx of recurrent abscesses, who presents to the ED with complaints of vaginal bleeding for the last 17 days.  Patient states that she typically has regular menstrual cycles every month that last about 4 to 5 days and are not very heavy.  On 09/25/2017 she started having a menstrual cycle which was fairly typical, lasted about 6 days and then it seems to be ending however she had intercourse and following that she began having vaginal bleeding again which was much heavier than normal menstrual cycle, and has continued to bleed since then.  She states that the bleeding is bright red and she is going through 7 heavy-duty pads per day and has started to pass some dark red clots.  This bleeding is much heavier than her typical cycle.  Her menstrual cycle before the one in late July was sometime in early June, she is not exactly sure what day it started.  Yesterday she began having abd pain, which she had not had before, which worsened today so she decided to come in for evaluation.  She describes the pain as 9/10 constant sharp nonradiating RLQ pain that worsens with standing and has been minimally improved with Tylenol.  She also feels lightheaded when she stands up and very fatigued and is concerned that she has lost a lot of blood.  She also mentions having a pressure-like dysuria for the last 6 months, which has not changed and is not new.  She denies any burning dysuria.  She is sexually active with one female partner, occasionally unprotected.  She states that she drinks alcohol about 3 times a month, had 2 beers yesterday.  She takes NSAIDs somewhat frequently.  She does not have an OBGYN or a PCP at this time.  She denies fevers, chills, CP, SOB,  nausea/vomiting, diarrhea/constipation, obstipation, melena, hematochezia, hematuria, urinary frequency, vaginal discharge, vaginal itching, genital sores, myalgias, arthralgias, numbness, tingling, focal weakness, or any other complaints at this time. Denies recent travel, sick contacts, or suspicious food intake.  She thinks she had "something scraped" in her pelvis when she was about 47y/o (?uterus) but denies any other abdominal surgeries.    The history is provided by the patient and medical records. No language interpreter was used.    Past Medical History:  Diagnosis Date  . Abscess   . Ankle fracture   . History of breast abscess 2013  . Rheumatic fever     Patient Active Problem List   Diagnosis Date Noted  . Mastitis 10/02/2013  . Left breast abscess 04/12/2011    Past Surgical History:  Procedure Laterality Date  . BREAST SURGERY    . INCISE AND DRAIN ABCESS     left breast abscess  . Incision & drainage left chest     left chest  . INCISION AND DRAINAGE OF WOUND N/A 09/06/2014   Procedure: IRRIGATION AND DEBRIDEMENT LEFT BREAST ABCESS;  Surgeon: Jackolyn Confer, MD;  Location: [facility];  Service: General;  Laterality: N/A;  . IRRIGATION AND DEBRIDEMENT ABSCESS Left 10/05/2013   Procedure: IRRIGATION AND DEBRIDEMENT left breast ABSCESS;  Surgeon: Merrie Roof, MD;  Location: [facility];  Service: General;  Laterality: Left;  . scar tissue removed  OB History   None      Home Medications    Prior to Admission medications   Medication Sig Start Date End Date Taking? Authorizing Provider  cephALEXin (KEFLEX) 500 MG capsule Take 1 capsule (500 mg total) by mouth 3 (three) times daily. Patient not taking: Reported on 10/09/2017 09/08/17   Jeannett Senior, PA-C  doxycycline (VIBRAMYCIN) 100 MG capsule Take 1 capsule (100 mg total) by mouth 2 (two) times daily. Patient not taking: Reported on 10/09/2017 05/27/17   McDonald, Mia A, PA-C  ibuprofen (ADVIL,MOTRIN) 200  MG tablet Take 400 mg by mouth daily as needed for moderate pain.    [provider]  oxyCODONE-acetaminophen (ROXICET) 5-325 MG tablet Take 1 tablet by mouth every 8 (eight) hours as needed for severe pain. Patient not taking: Reported on 10/09/2017 05/27/17   Joanne Gavel, PA-C    Family History Family History  Problem Relation Age of Onset  . Heart disease Paternal Grandmother     Social History Social History   Tobacco Use  . Smoking status: Current Every Day Smoker    Packs/day: 0.50    Years: 28.00    Pack years: 14.00    Types: Cigarettes  . Smokeless tobacco: Never Used  Substance Use Topics  . Alcohol use: Yes    Comment: 1/2 bottle wine on weekends  . Drug use: No     Allergies   Hydrocodone   Review of Systems Review of Systems  Constitutional: Positive for fatigue. Negative for chills and fever.  Respiratory: Negative for shortness of breath.   Cardiovascular: Negative for chest pain.  Gastrointestinal: Positive for abdominal pain. Negative for blood in stool, constipation, diarrhea, nausea and vomiting.  Genitourinary: Positive for dysuria (pressure for 6 months), menstrual problem and vaginal bleeding. Negative for frequency, genital sores, hematuria, vaginal discharge and vaginal pain.  Musculoskeletal: Negative for arthralgias and myalgias.  Skin: Negative for color change.  Allergic/Immunologic: Negative for immunocompromised state.  Neurological: Positive for light-headedness (with standing). Negative for weakness and numbness.  Psychiatric/Behavioral: Negative for confusion.   All other systems reviewed and are negative for acute change except as noted in the HPI.    Physical Exam Updated Vital Signs BP (!) 142/97 (BP Location: Right Arm)   Pulse 99   Temp 98.3 F (36.8 C) (Oral)   Resp 15   Ht 5\' 6"  (1.676 m)   Wt 73.5 kg (162 lb)   LMP 10/01/2017   SpO2 100%   BMI 26.15 kg/m   Physical Exam  Constitutional: She is oriented  to person, place, and time. Vital signs are normal. She appears well-developed and well-nourished.  Non-toxic appearance. No distress.  Afebrile, nontoxic, NAD  HENT:  Head: Normocephalic and atraumatic.  Mouth/Throat: Oropharynx is clear and moist and mucous membranes are normal.  Eyes: Conjunctivae and EOM are normal. Right eye exhibits no discharge. Left eye exhibits no discharge.  Neck: Normal range of motion. Neck supple.  Cardiovascular: Normal rate, regular rhythm, normal heart sounds and intact distal pulses. Exam reveals no gallop and no friction rub.  No murmur heard. Pulmonary/Chest: Effort normal and breath sounds normal. No respiratory distress. She has no decreased breath sounds. She has no wheezes. She has no rhonchi. She has no rales.  Abdominal: Soft. Normal appearance and bowel sounds are normal. She exhibits no distension. There is tenderness in the right lower quadrant and suprapubic area. There is no rigidity, no rebound, no guarding, no CVA tenderness, no tenderness at McBurney's  point and negative Murphy's sign.  Soft, nondistended, +BS throughout, with mild suprapubic and RLQ TTP, no r/g/r, neg murphy's, no focal mcburney's point TTP, no CVA TTP   Genitourinary: Pelvic exam was performed with patient supine. There is no rash, tenderness or lesion on the right labia. There is no rash, tenderness or lesion on the left labia. Uterus is enlarged and tender. Uterus is not deviated and not fixed. Cervix exhibits no motion tenderness, no discharge and no friability. Right adnexum displays tenderness. Right adnexum displays no mass and no fullness. Left adnexum displays no mass, no tenderness and no fullness. There is bleeding in the vagina. No erythema or tenderness in the vagina. No vaginal discharge found.  Genitourinary Comments: Chaperone present for exam. No rashes, lesions, or tenderness to external genitalia. No erythema, injury, or tenderness to vaginal mucosa. No vaginal  discharge within vaginal vault, very mild dark red blood in vaginal vault which is coming from the cervix. No adnexal masses or fullness but mild/moderate R adnexal TTP. Uterus nondeviated and mobile however diffusely TTP and somewhat enlarged, ?fibroid palpable.  No CMT (although diffuse uterine tenderness), no cervical friability or discharge from cervical os. Cervical os is closed.   Musculoskeletal: Normal range of motion.  Neurological: She is alert and oriented to person, place, and time. She has normal strength. No sensory deficit.  Skin: Skin is warm, dry and intact. No rash noted.  Psychiatric: She has a normal mood and affect.  Nursing note and vitals reviewed.    ED Treatments / Results  Labs (all labs ordered are listed, but only abnormal results are displayed) Labs Reviewed  WET PREP, GENITAL - Abnormal; Notable for the following components:      Result Value   Clue Cells Wet Prep HPF POC PRESENT (*)    WBC, Wet Prep HPF POC FEW (*)    All other components within normal limits  CBC WITH DIFFERENTIAL/PLATELET - Abnormal; Notable for the following components:   RBC 3.32 (*)    Hemoglobin 9.7 (*)    HCT 29.6 (*)    RDW 15.8 (*)    All other components within normal limits  COMPREHENSIVE METABOLIC PANEL - Abnormal; Notable for the following components:   Glucose, Bld 108 (*)    Calcium 8.7 (*)    AST 14 (*)    All other components within normal limits  URINALYSIS, ROUTINE W REFLEX MICROSCOPIC - Abnormal; Notable for the following components:   Color, Urine BROWN (*)    APPearance CLOUDY (*)    Hgb urine dipstick LARGE (*)    Protein, ur 30 (*)    Leukocytes, UA SMALL (*)    All other components within normal limits  URINALYSIS, MICROSCOPIC (REFLEX) - Abnormal; Notable for the following components:   Bacteria, UA MANY (*)    All other components within normal limits  URINE CULTURE  RPR  HIV ANTIBODY (ROUTINE TESTING)  POC URINE PREG, ED  TYPE AND SCREEN    GC/CHLAMYDIA PROBE AMP ([place]) NOT AT [facility]    EKG None  Radiology US Pelvis Transvanginal Non-ob (tv Only)  Result Date: 10/18/2017 CLINICAL DATA:  47 year old with vaginal bleeding for 17 days and right lower quadrant/right adnexal tenderness. Evaluate for fibroids, ovarian cyst or torsion. LMP 09/25/2017. EXAM: TRANSABDOMINAL AND TRANSVAGINAL ULTRASOUND OF PELVIS DOPPLER ULTRASOUND OF OVARIES TECHNIQUE: Both transabdominal and transvaginal ultrasound examinations of the pelvis were performed. Transabdominal technique was performed for global imaging of the pelvis including uterus, ovaries, adnexal regions,  and pelvic cul-de-sac. It was necessary to proceed with endovaginal exam following the transabdominal exam to visualize the endometrium and ovaries to better advantage. Color and duplex Doppler ultrasound was utilized to evaluate blood flow to the ovaries. COMPARISON:  None. FINDINGS: Uterus Measurements: 11.3 x 6.4 x 6.7 cm. There is an ill-defined, slightly echogenic structure in the fundal region centrally which is probably a fibroid. This measures approximately 5.6 x 5.0 x 4.8 cm. The endometrium is distorted in this area, and focal adenomyosis would be a consideration. Endometrium Thickness: 4 mm. The endometrium is distorted in the fundal region as noted above. Prominent cervical nabothian cysts. Right ovary Measurements: 3.1 x 2.2 x 2.1 cm. Probable small collapsing corpus luteum. Normal blood flow with color Doppler. Left ovary Measurements: 2.8 x 1.8 x 1.9 cm. Normal blood flow with color Doppler. Tubular cystic structure in the left adnexa measuring 3.7 x 1.5 x 2.1 cm, possibly a paraovarian cysts or dilated fallopian tube. Pulsed Doppler evaluation of both ovaries demonstrates normal low-resistance arterial and venous waveforms. Other findings Small amount of free pelvic fluid. IMPRESSION: 1. Central uterine fundal fibroid versus focal adenomyosis. Resulting endometrial distortion  without apparent thickening. 2. No evidence of ovarian torsion. 3. Possible left paraovarian cyst versus hydrosalpinx. 4. If the patient has persistent unexplained symptoms, consider follow-up ultrasound or pelvic MRI for further evaluation. Electronically Signed   By: Richardean Sale M.D.   On: 10/18/2017 20:50   US Pelvis (transabdominal Only)  Result Date: 10/18/2017 CLINICAL DATA:  47 year old with vaginal bleeding for 17 days and right lower quadrant/right adnexal tenderness. Evaluate for fibroids, ovarian cyst or torsion. LMP 09/25/2017. EXAM: TRANSABDOMINAL AND TRANSVAGINAL ULTRASOUND OF PELVIS DOPPLER ULTRASOUND OF OVARIES TECHNIQUE: Both transabdominal and transvaginal ultrasound examinations of the pelvis were performed. Transabdominal technique was performed for global imaging of the pelvis including uterus, ovaries, adnexal regions, and pelvic cul-de-sac. It was necessary to proceed with endovaginal exam following the transabdominal exam to visualize the endometrium and ovaries to better advantage. Color and duplex Doppler ultrasound was utilized to evaluate blood flow to the ovaries. COMPARISON:  None. FINDINGS: Uterus Measurements: 11.3 x 6.4 x 6.7 cm. There is an ill-defined, slightly echogenic structure in the fundal region centrally which is probably a fibroid. This measures approximately 5.6 x 5.0 x 4.8 cm. The endometrium is distorted in this area, and focal adenomyosis would be a consideration. Endometrium Thickness: 4 mm. The endometrium is distorted in the fundal region as noted above. Prominent cervical nabothian cysts. Right ovary Measurements: 3.1 x 2.2 x 2.1 cm. Probable small collapsing corpus luteum. Normal blood flow with color Doppler. Left ovary Measurements: 2.8 x 1.8 x 1.9 cm. Normal blood flow with color Doppler. Tubular cystic structure in the left adnexa measuring 3.7 x 1.5 x 2.1 cm, possibly a paraovarian cysts or dilated fallopian tube. Pulsed Doppler evaluation of both  ovaries demonstrates normal low-resistance arterial and venous waveforms. Other findings Small amount of free pelvic fluid. IMPRESSION: 1. Central uterine fundal fibroid versus focal adenomyosis. Resulting endometrial distortion without apparent thickening. 2. No evidence of ovarian torsion. 3. Possible left paraovarian cyst versus hydrosalpinx. 4. If the patient has persistent unexplained symptoms, consider follow-up ultrasound or pelvic MRI for further evaluation. Electronically Signed   By: Richardean Sale M.D.   On: 10/18/2017 20:50   US Pelvic Doppler (torsion R/o Or Mass Arterial Flow)  Result Date: 10/18/2017 CLINICAL DATA:  47 year old with vaginal bleeding for 17 days and right lower quadrant/right adnexal tenderness. Evaluate for  fibroids, ovarian cyst or torsion. LMP 09/25/2017. EXAM: TRANSABDOMINAL AND TRANSVAGINAL ULTRASOUND OF PELVIS DOPPLER ULTRASOUND OF OVARIES TECHNIQUE: Both transabdominal and transvaginal ultrasound examinations of the pelvis were performed. Transabdominal technique was performed for global imaging of the pelvis including uterus, ovaries, adnexal regions, and pelvic cul-de-sac. It was necessary to proceed with endovaginal exam following the transabdominal exam to visualize the endometrium and ovaries to better advantage. Color and duplex Doppler ultrasound was utilized to evaluate blood flow to the ovaries. COMPARISON:  None. FINDINGS: Uterus Measurements: 11.3 x 6.4 x 6.7 cm. There is an ill-defined, slightly echogenic structure in the fundal region centrally which is probably a fibroid. This measures approximately 5.6 x 5.0 x 4.8 cm. The endometrium is distorted in this area, and focal adenomyosis would be a consideration. Endometrium Thickness: 4 mm. The endometrium is distorted in the fundal region as noted above. Prominent cervical nabothian cysts. Right ovary Measurements: 3.1 x 2.2 x 2.1 cm. Probable small collapsing corpus luteum. Normal blood flow with color Doppler.  Left ovary Measurements: 2.8 x 1.8 x 1.9 cm. Normal blood flow with color Doppler. Tubular cystic structure in the left adnexa measuring 3.7 x 1.5 x 2.1 cm, possibly a paraovarian cysts or dilated fallopian tube. Pulsed Doppler evaluation of both ovaries demonstrates normal low-resistance arterial and venous waveforms. Other findings Small amount of free pelvic fluid. IMPRESSION: 1. Central uterine fundal fibroid versus focal adenomyosis. Resulting endometrial distortion without apparent thickening. 2. No evidence of ovarian torsion. 3. Possible left paraovarian cyst versus hydrosalpinx. 4. If the patient has persistent unexplained symptoms, consider follow-up ultrasound or pelvic MRI for further evaluation. Electronically Signed   By: Richardean Sale M.D.   On: 10/18/2017 20:50    Procedures Procedures (including critical care time)  Medications Ordered in ED Medications  cefTRIAXone (ROCEPHIN) 1 g in sodium chloride 0.9 % 100 mL IVPB (1 g Intravenous New Bag/Given 10/18/17 2226)  morphine 4 MG/ML injection 4 mg (4 mg Intravenous Given 10/18/17 1850)  sodium chloride 0.9 % bolus 1,000 mL (0 mLs Intravenous Stopped 10/18/17 2158)     Initial Impression / Assessment and Plan / ED Course  I have reviewed the triage vital signs and the nursing notes.  Pertinent labs & imaging results that were available during my care of the patient were reviewed by me and considered in my medical decision making (see chart for details).     47 y.o. female here with vaginal bleeding x17 days, started as usual menses then had intercourse and bleeding returned and became much heavier than usual menses. Yesterday started having RLQ pain. On exam, mild RLQ and suprapubic TTP, nonperitoneal, no focal Mcburney's point tenderness. Will proceed with pelvic exam, will get labs and STD testing, give pain meds and fluids, then reassess. Will decide on imaging based on pelvic exam findings; likely will get pelvic U/S.   6:50  PM Upreg neg. Remainder of labs pending. Pelvic exam reveals mild amount of dark red blood in vaginal vault which is coming from the cervix, mild/moderate R adnexal TTP, diffuse uterine TTP and enlargement, ?fibroids palpable. No CMT but just diffuse uterine TTP. No vaginal or cervical discharge. Will proceed with pelvic U/S to further evaluate her DUB and R adnexal tenderness. Will reassess shortly.   9:52 PM CBC w/diff with stable anemia from last visit on 10/09/17 (no other recent prior labs to compare to, last labs in our system are from 09/06/14). CMP unremarkable. U/A with large hgb, no nitrites, small leuks, 11-20 RBCs  and WBCs, many bacteria, and only 0-5 squamous. Could still be from vaginal contamination, but with many bacteria and 11-20 WBCs, in addition to her symptoms, will empirically treat for UTI; UCx sent. Wet prep with clue cells which is likely from blood, few WBCs; doubt BV given lack of symptoms, doubt need for empiric GC/CT treatment based on lack of findings suspicious for that. Pelvic U/S reveals central uterine fundal fibroid vs focal adenomyosis resulting in endometrial distortion without apparent thickening; also with possible left paraovarian cyst vs hydrosalpinx. Given lack of left sided symptoms, doubt this is clinically relevant to today's issue, and is likely a cyst rather than a hydrosalpinx. Pt feeling better. Will give rocephin here, and then likely d/c home with UTI tx and mefenamic acid to help with DUB, advised other OTC remedies for symptomatic relief, and f/up with OBGYN in 5-7 days for recheck of symptoms and ongoing management of her DUB/fibroids. Will reassess once rocephin is done.   10:55 PM Rocephin nearly finished. Pt continues to feel improved. Will d/c home with previously outlined plan. Also discussed abstinence until STD testing results come back, advised f/up with health dept for future STD concerns, having partners tested and treated before re-engaging in  intercourse, and safe sex advised.  F/up with OBGYN in 5-7 days for recheck of symptoms and ongoing management of her uterine fibroids/vaginal bleeding. I explained the diagnosis and have given explicit precautions to return to the ER including for any other new or worsening symptoms. The patient understands and accepts the medical plan as it's been dictated and I have answered their questions. Discharge instructions concerning home care and prescriptions have been given. The patient is STABLE and is discharged to home in good condition.    Final Clinical Impressions(s) / ED Diagnoses   Final diagnoses:  Right adnexal tenderness  Acute right lower quadrant pain  Abnormal uterine bleeding (AUB)  Chronic anemia  Acute lower UTI  Uterine leiomyoma, unspecified location    ED Discharge Orders        Ordered    Mefenamic Acid 250 MG CAPS  3 times daily     10/18/17 2216    cephALEXin (KEFLEX) 500 MG capsule     10/18/17 [address], [place], [place] 10/18/17 2255    Dorie Rank, MD 10/18/17 2344

## 2017-10-18 NOTE — Discharge Instructions (Addendum)
Your work up today revealed that you have mild anemia, which is likely from your vaginal bleeding, and that you have a fibroid in your uterus which is probably the cause of your vaginal bleeding. Start taking mefenamic acid as directed to help control the vaginal bleeding. Alternate between tylenol and ibuprofen as needed for pain, and use heat/warm compress to the areas of pain. Stay well hydrated and get plenty of rest.  You also appear to have a urinary tract infection. Take antibiotic as directed until completed. May consider over-the-counter Pyridium (Azo) for pain relief, but don't take this longer than 3 days, and be aware that it may turn your urine bright orange. This is a harmless side effect.   Follow up with the women's outpatient OBGYN clinic in 5-7 days for recheck of symptoms and for ongoing management of your vaginal bleeding/uterine fibroid. Go to the [facility] (similar to their version of an ER) for any emergent changes or worsening symptoms.   Also, You have been tested for gonorrhea, chlamydia, HIV, and Syphilis, and the hospital will call you if the lab is positive. DO NOT ENGAGE IN SEXUAL ACTIVITY UNTIL YOU FIND OUT ABOUT YOUR RESULTS AND HAVE PARTNERS TESTED AND TREATED. ALL PARTNERS MUST BE TESTED AND TREATED FOR STD'S. ALWAYS USE CONDOMS WHEN ENGAGING IN INTERCOURSE. Follow up with [facility] Department STD clinic for future STD concerns or screenings, treatment, etc.    Please seek immediate care if you develop the following: You develop back pain.  Your symptoms are no better, or worse in 3 days. There is severe back pain or lower abdominal pain.  You develop chills.  You have a fever.  There is nausea or vomiting.  There is continued burning or discomfort with urination.

## 2017-10-19 LAB — RPR: RPR Ser Ql: NONREACTIVE

## 2017-10-19 LAB — ABO/RH: ABO/RH(D): A POS

## 2017-10-19 LAB — HIV ANTIBODY (ROUTINE TESTING W REFLEX): HIV Screen 4th Generation wRfx: NONREACTIVE

## 2017-10-21 ENCOUNTER — Telehealth: Payer: Self-pay | Admitting: *Deleted

## 2017-10-21 LAB — GC/CHLAMYDIA PROBE AMP (~~LOC~~) NOT AT ARMC
CHLAMYDIA, DNA PROBE: NEGATIVE
NEISSERIA GONORRHEA: NEGATIVE

## 2017-10-21 NOTE — Telephone Encounter (Signed)
Pt left message stating that she was seen @ [facility] ED and told to follow up in our office due to fibroids. She requests an appt ASAP. Per chart review pt also was having abnormal vaginal bleeding. The ED provider notes recommend follow up in 5-7 days. Message forwarded to scheduling staff.

## 2017-11-25 ENCOUNTER — Other Ambulatory Visit (HOSPITAL_COMMUNITY)
Admission: RE | Admit: 2017-11-25 | Discharge: 2017-11-25 | Disposition: A | Discharge status: Home / Self Care | Payer: 59 | Source: Ambulatory Visit | Attending: Obstetrics and Gynecology | Admitting: Obstetrics and Gynecology

## 2017-11-25 ENCOUNTER — Telehealth: Payer: Self-pay

## 2017-11-25 ENCOUNTER — Encounter: Payer: Self-pay | Admitting: Obstetrics and Gynecology

## 2017-11-25 ENCOUNTER — Ambulatory Visit (INDEPENDENT_AMBULATORY_CARE_PROVIDER_SITE_OTHER): Payer: 59 | Admitting: Obstetrics and Gynecology

## 2017-11-25 VITALS — BP 122/75 | HR 84 | Ht 66.0 in | Wt 170.5 lb

## 2017-11-25 DIAGNOSIS — Z01419 Encounter for gynecological examination (general) (routine) without abnormal findings: Secondary | ICD-10-CM

## 2017-11-25 DIAGNOSIS — N939 Abnormal uterine and vaginal bleeding, unspecified: Secondary | ICD-10-CM | POA: Insufficient documentation

## 2017-11-25 DIAGNOSIS — D649 Anemia, unspecified: Secondary | ICD-10-CM | POA: Diagnosis not present

## 2017-11-25 DIAGNOSIS — D219 Benign neoplasm of connective and other soft tissue, unspecified: Secondary | ICD-10-CM | POA: Insufficient documentation

## 2017-11-25 DIAGNOSIS — Z124 Encounter for screening for malignant neoplasm of cervix: Secondary | ICD-10-CM | POA: Diagnosis not present

## 2017-11-25 DIAGNOSIS — Z1151 Encounter for screening for human papillomavirus (HPV): Secondary | ICD-10-CM | POA: Diagnosis not present

## 2017-11-25 DIAGNOSIS — Z1231 Encounter for screening mammogram for malignant neoplasm of breast: Secondary | ICD-10-CM | POA: Insufficient documentation

## 2017-11-25 LAB — POCT PREGNANCY, URINE: PREG TEST UR: NEGATIVE

## 2017-11-25 NOTE — Progress Notes (Signed)
Patient ID: [name], female   DOB: 1971-02-02, 47 y.o.   MRN: [id number]  [name] is a 47 y.o. G0P0000 female here for a routine annual gynecologic exam.  Pt reports AUB for the last 6 months. Cycles prior were monthly and lasted 4-5 days. Cycles now are longer, last one lasted 17 days, heavier with cramps and clots. Also has had 2 cycles in a month.  Was seen in [facility] ER in August. U/S revealed fibroid, possible left paraovarian cyst vs hydrosalpinx. She reports a vaginal "boil" which has ruptured and is healing. 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 She also has a H/O left breast abscess. Last mammogram was 2105 or 16. Uncertain pap. Denies H/O abnormal Pap or STD. Sexual active without contraception. Nulligravida. No chronic medical problems or medications. No PCP, just obtained insurance   Gynecologic History Patient's last menstrual period was 11/11/2017 (approximate). Contraception: none Last Pap: Unknown. Results were: unknown Last mammogram: 2015/16. Results were: abnormal abscess  Obstetric History OB History  Gravida Para Term Preterm AB Living  0 0 0 0 0 0  SAB TAB Ectopic Multiple Live Births  0 0 0 0 0    Past Medical History:  Diagnosis Date  . Abscess   . Ankle fracture   . History of breast abscess 2013  . Medical history non-contributory   . Rheumatic fever     Past Surgical History:  Procedure Laterality Date  . BREAST SURGERY    . INCISE AND DRAIN ABCESS     left breast abscess  . Incision & drainage left chest     left chest  . INCISION AND DRAINAGE OF WOUND N/A 09/06/2014   Procedure: IRRIGATION AND DEBRIDEMENT LEFT BREAST ABCESS;  Surgeon: Jackolyn Confer, MD;  Location: [facility] ORS;  Service: General;  Laterality: N/A;  . IRRIGATION AND DEBRIDEMENT ABSCESS Left 10/05/2013   Procedure: IRRIGATION AND DEBRIDEMENT left breast ABSCESS;  Surgeon: Merrie Roof, MD;  Location: [facility] ORS;  Service: General;  Laterality: Left;  . scar tissue removed       Current Outpatient Medications on File Prior to Visit  Medication Sig Dispense Refill  . acetaminophen (TYLENOL) 500 MG tablet Take 1,000 mg by mouth daily as needed for moderate pain.    . cephALEXin (KEFLEX) 500 MG capsule 2 caps po bid x 7 days 28 capsule 0  . Mefenamic Acid 250 MG CAPS Take 2 capsules (500 mg total) by mouth 3 (three) times daily. x4-5 days or until bleeding stops 30 each 0   No current facility-administered medications on file prior to visit.     Allergies  Allergen Reactions  . Hydrocodone Itching    Pt tolerates oxycodone    Social History   Socioeconomic History  . Marital status: Divorced    Spouse name: Not on file  . Number of children: Not on file  . Years of education: Not on file  . Highest education level: Not on file  Occupational History  . Not on file  Social Needs  . Financial resource strain: Not on file  . Food insecurity:    Worry: Not on file    Inability: Not on file  . Transportation needs:    Medical: Not on file    Non-medical: Not on file  Tobacco Use  . Smoking status: Current Every Day Smoker    Packs/day: 0.50    Years: 28.00    Pack years: 14.00    Types: Cigarettes  .  Smokeless tobacco: Never Used  Substance and Sexual Activity  . Alcohol use: Yes    Comment: 1/2 bottle wine on weekends  . Drug use: No  . Sexual activity: Never  Lifestyle  . Physical activity:    Days per week: Not on file    Minutes per session: Not on file  . Stress: Not on file  Relationships  . Social connections:    Talks on phone: Not on file    Gets together: Not on file    Attends religious service: Not on file    Active member of club or organization: Not on file    Attends meetings of clubs or organizations: Not on file    Relationship status: Not on file  . Intimate partner violence:    Fear of current or ex partner: Not on file    Emotionally abused: Not on file    Physically abused: Not on file    Forced sexual  activity: Not on file  Other Topics Concern  . Not on file  Social History Narrative  . Not on file    Family History  Problem Relation Age of Onset  . Heart disease Paternal Grandmother     The following portions of the patient's history were reviewed and updated as appropriate: allergies, current medications, past family history, past medical history, past social history, past surgical history and problem list.  Review of Systems Pertinent items noted in HPI and remainder of comprehensive ROS otherwise negative.   Objective:  BP 122/75   Pulse 84   Ht 5\' 6"  (1.676 m)   Wt 170 lb 8 oz (77.3 kg)   LMP 11/11/2017 (Approximate)   BMI 27.52 kg/m  CONSTITUTIONAL: Well-developed, well-nourished female in no acute distress.  HENT:  Normocephalic, atraumatic, External right and left ear normal. Oropharynx is clear and moist EYES: Conjunctivae and EOM are normal. Pupils are equal, round, and reactive to light. No scleral icterus.  NECK: Normal range of motion, supple, no masses.  Normal thyroid.  SKIN: Skin is warm and dry. No rash noted. Not diaphoretic. No erythema. No pallor. [place]: Alert and oriented to person, place, and time. Normal reflexes, muscle tone coordination. No cranial nerve deficit noted. PSYCHIATRIC: Normal mood and affect. Normal behavior. Normal judgment and thought content. CARDIOVASCULAR: Normal heart rate noted, regular rhythm RESPIRATORY: Clear to auscultation bilaterally. Effort and breath sounds normal, no problems with respiration noted. BREASTS: no evaluated ABDOMEN: Soft, normal bowel sounds, no distention noted.  No tenderness, rebound or guarding.  PELVIC: Healing sebaceous cysts on right labial noted, several sebaceous cysts note on labial bilaterally cervix without lesions, pap smear obtained, uterus 10 week size, mobile slightly tender, no adnexal masses or tenderness MUSCULOSKELETAL: Normal range of motion. No tenderness.  No cyanosis, clubbing, or  edema.  2+ distal pulses.    ENDOMETRIAL BIOPSY     The indications for endometrial biopsy were reviewed.   Risks of the biopsy including cramping, bleeding, infection, uterine perforation, inadequate specimen and need for additional procedures  were discussed. The patient states she understands and agrees to undergo procedure today. Consent was signed. Time out was performed. Urine HCG was negative. During the pelvic exam, the cervix was prepped with Betadine. A single-toothed tenaculum was placed on the anterior lip of the cervix to stabilize it. The 3 mm pipelle was introduced into the endometrial cavity without difficulty to a depth of 8cm, and a moderate amount of tissue was obtained and sent to pathology. The instruments  were removed from the patient's vagina. Minimal bleeding from the cervix was noted. The patient tolerated the procedure well. Routine post-procedure instructions were given to the patient.     Assessment:  Annual gynecologic examination with pap smear Screening mammogram AUB Fibroid Anemia Sebaceous cyst Plan:  Will follow up results of pap smear and manage accordingly. Mammogram scheduled EMBX completed for AUB. Discussed AUB/uterine fibroids with pt. Tx pending test results  Pt will follow up in 2 weeks to discuss results and Tx plan.  Routine preventative health maintenance measures emphasized. Please refer to After Visit Summary for other counseling recommendations.    Chancy Milroy, MD, [place] Attending [place] for [facility], [place]

## 2017-11-25 NOTE — Telephone Encounter (Signed)
Called pt to advise of Mammogram Screening at [facility] on 12/24/17 @ 12noon. No answer, mail box full.

## 2017-11-25 NOTE — Patient Instructions (Signed)
Abnormal Uterine Bleeding Abnormal uterine bleeding means bleeding more than usual from your uterus. It can include:  Bleeding between periods.  Bleeding after sex.  Bleeding that is heavier than normal.  Periods that last longer than usual.  Bleeding after you have stopped having your period (menopause).  There are many problems that may cause this. You should see a doctor for any kind of bleeding that is not normal. Treatment depends on the cause of the bleeding. Follow these instructions at home:  Watch your condition for any changes.  Do not use tampons, douche, or have sex, if your doctor tells you not to.  Change your pads often.  Get regular well-woman exams. Make sure they include a pelvic exam and cervical cancer screening.  Keep all follow-up visits as told by your doctor. This is important. Contact a doctor if:  The bleeding lasts more than one week.  You feel dizzy at times.  You feel like you are going to throw up (nauseous).  You throw up. Get help right away if:  You pass out.  You have to change pads every hour.  You have belly (abdominal) pain.  You have a fever.  You get sweaty.  You get weak.  You passing large blood clots from your vagina. Summary  Abnormal uterine bleeding means bleeding more than usual from your uterus.  There are many problems that may cause this. You should see a doctor for any kind of bleeding that is not normal.  Treatment depends on the cause of the bleeding. This information is not intended to replace advice given to you by your health care provider. Make sure you discuss any questions you have with your health care provider. Document Released: 12/31/2008 Document Revised: 02/28/2016 Document Reviewed: 02/28/2016 Elsevier Interactive Patient Education  2017 [place] Maintenance, Female Adopting a healthy lifestyle and getting preventive care can go a long way to promote health and wellness. Talk with  your health care provider about what schedule of regular examinations is right for you. This is a good chance for you to check in with your provider about disease prevention and staying healthy. In between checkups, there are plenty of things you can do on your own. Experts have done a lot of research about which lifestyle changes and preventive measures are most likely to keep you healthy. Ask your health care provider for more information. Weight and diet Eat a healthy diet  Be sure to include plenty of vegetables, fruits, low-fat dairy products, and lean protein.  Do not eat a lot of foods high in solid fats, added sugars, or salt.  Get regular exercise. This is one of the most important things you can do for your health. ? Most adults should exercise for at least 150 minutes each week. The exercise should increase your heart rate and make you sweat (moderate-intensity exercise). ? Most adults should also do strengthening exercises at least twice a week. This is in addition to the moderate-intensity exercise.  Maintain a healthy weight  Body mass index (BMI) is a measurement that can be used to identify possible weight problems. It estimates body fat based on height and weight. Your health care provider can help determine your BMI and help you achieve or maintain a healthy weight.  For females 77 years of age and older: ? A BMI below 18.5 is considered underweight. ? A BMI of 18.5 to 24.9 is normal. ? A BMI of 25 to 29.9 is considered overweight. ? A  BMI of 30 and above is considered obese.  Watch levels of cholesterol and blood lipids  You should start having your blood tested for lipids and cholesterol at 47 years of age, then have this test every 5 years.  You may need to have your cholesterol levels checked more often if: ? Your lipid or cholesterol levels are high. ? You are older than 47 years of age. ? You are at high risk for heart disease.  Cancer screening Lung  Cancer  Lung cancer screening is recommended for adults 6-5 years old who are at high risk for lung cancer because of a history of smoking.  A yearly low-dose CT scan of the lungs is recommended for people who: ? Currently smoke. ? Have quit within the past 15 years. ? Have at least a 30-pack-year history of smoking. A pack year is smoking an average of one pack of cigarettes a day for 1 year.  Yearly screening should continue until it has been 15 years since you quit.  Yearly screening should stop if you develop a health problem that would prevent you from having lung cancer treatment.  Breast Cancer  Practice breast self-awareness. This means understanding how your breasts normally appear and feel.  It also means doing regular breast self-exams. Let your health care provider know about any changes, no matter how small.  If you are in your 20s or 30s, you should have a clinical breast exam (CBE) by a health care provider every 1-3 years as part of a regular health exam.  If you are 23 or older, have a CBE every year. Also consider having a breast X-ray (mammogram) every year.  If you have a family history of breast cancer, talk to your health care provider about genetic screening.  If you are at high risk for breast cancer, talk to your health care provider about having an MRI and a mammogram every year.  Breast cancer gene (BRCA) assessment is recommended for women who have family members with BRCA-related cancers. BRCA-related cancers include: ? Breast. ? Ovarian. ? Tubal. ? Peritoneal cancers.  Results of the assessment will determine the need for genetic counseling and BRCA1 and BRCA2 testing.  Cervical Cancer Your health care provider may recommend that you be screened regularly for cancer of the pelvic organs (ovaries, uterus, and vagina). This screening involves a pelvic examination, including checking for microscopic changes to the surface of your cervix (Pap test). You  may be encouraged to have this screening done every 3 years, beginning at age 71.  For women ages 63-65, health care providers may recommend pelvic exams and Pap testing every 3 years, or they may recommend the Pap and pelvic exam, combined with testing for human papilloma virus (HPV), every 5 years. Some types of HPV increase your risk of cervical cancer. Testing for HPV may also be done on women of any age with unclear Pap test results.  Other health care providers may not recommend any screening for nonpregnant women who are considered low risk for pelvic cancer and who do not have symptoms. Ask your health care provider if a screening pelvic exam is right for you.  If you have had past treatment for cervical cancer or a condition that could lead to cancer, you need Pap tests and screening for cancer for at least 20 years after your treatment. If Pap tests have been discontinued, your risk factors (such as having a new sexual partner) need to be reassessed to determine if screening  should resume. Some women have medical problems that increase the chance of getting cervical cancer. In these cases, your health care provider may recommend more frequent screening and Pap tests.  Colorectal Cancer  This type of cancer can be detected and often prevented.  Routine colorectal cancer screening usually begins at 47 years of age and continues through 47 years of age.  Your health care provider may recommend screening at an earlier age if you have risk factors for colon cancer.  Your health care provider may also recommend using home test kits to check for hidden blood in the stool.  A small camera at the end of a tube can be used to examine your colon directly (sigmoidoscopy or colonoscopy). This is done to check for the earliest forms of colorectal cancer.  Routine screening usually begins at age 58.  Direct examination of the colon should be repeated every 5-10 years through 47 years of age.  However, you may need to be screened more often if early forms of precancerous polyps or small growths are found.  Skin Cancer  Check your skin from head to toe regularly.  Tell your health care provider about any new moles or changes in moles, especially if there is a change in a mole's shape or color.  Also tell your health care provider if you have a mole that is larger than the size of a pencil eraser.  Always use sunscreen. Apply sunscreen liberally and repeatedly throughout the day.  Protect yourself by wearing long sleeves, pants, a wide-brimmed hat, and sunglasses whenever you are outside.  Heart disease, diabetes, and high blood pressure  High blood pressure causes heart disease and increases the risk of stroke. High blood pressure is more likely to develop in: ? People who have blood pressure in the high end of the normal range (130-139/85-89 mm Hg). ? People who are overweight or obese. ? People who are African American.  If you are 42-76 years of age, have your blood pressure checked every 3-5 years. If you are 58 years of age or older, have your blood pressure checked every year. You should have your blood pressure measured twice-once when you are at a hospital or clinic, and once when you are not at a hospital or clinic. Record the average of the two measurements. To check your blood pressure when you are not at a hospital or clinic, you can use: ? An automated blood pressure machine at a pharmacy. ? A home blood pressure monitor.  If you are between 108 years and 79 years old, ask your health care provider if you should take aspirin to prevent strokes.  Have regular diabetes screenings. This involves taking a blood sample to check your fasting blood sugar level. ? If you are at a normal weight and have a low risk for diabetes, have this test once every three years after 47 years of age. ? If you are overweight and have a high risk for diabetes, consider being tested at a  younger age or more often. Preventing infection Hepatitis B  If you have a higher risk for hepatitis B, you should be screened for this virus. You are considered at high risk for hepatitis B if: ? You were born in a country where hepatitis B is common. Ask your health care provider which countries are considered high risk. ? Your parents were born in a high-risk country, and you have not been immunized against hepatitis B (hepatitis B vaccine). ? You have HIV  or AIDS. ? You use needles to inject street drugs. ? You live with someone who has hepatitis B. ? You have had sex with someone who has hepatitis B. ? You get hemodialysis treatment. ? You take certain medicines for conditions, including cancer, organ transplantation, and autoimmune conditions.  Hepatitis C  Blood testing is recommended for: ? Everyone born from [birth year] through 1965. ? Anyone with known risk factors for hepatitis C.  Sexually transmitted infections (STIs)  You should be screened for sexually transmitted infections (STIs) including gonorrhea and chlamydia if: ? You are sexually active and are younger than 47 years of age. ? You are older than 47 years of age and your health care provider tells you that you are at risk for this type of infection. ? Your sexual activity has changed since you were last screened and you are at an increased risk for chlamydia or gonorrhea. Ask your health care provider if you are at risk.  If you do not have HIV, but are at risk, it may be recommended that you take a prescription medicine daily to prevent HIV infection. This is called pre-exposure prophylaxis (PrEP). You are considered at risk if: ? You are sexually active and do not regularly use condoms or know the HIV status of your partner(s). ? You take drugs by injection. ? You are sexually active with a partner who has HIV.  Talk with your health care provider about whether you are at high risk of being infected with HIV. If you  choose to begin PrEP, you should first be tested for HIV. You should then be tested every 3 months for as long as you are taking PrEP. Pregnancy  If you are premenopausal and you may become pregnant, ask your health care provider about preconception counseling.  If you may become pregnant, take 400 to 800 micrograms (mcg) of folic acid every day.  If you want to prevent pregnancy, talk to your health care provider about birth control (contraception). Osteoporosis and menopause  Osteoporosis is a disease in which the bones lose minerals and strength with aging. This can result in serious bone fractures. Your risk for osteoporosis can be identified using a bone density scan.  If you are 66 years of age or older, or if you are at risk for osteoporosis and fractures, ask your health care provider if you should be screened.  Ask your health care provider whether you should take a calcium or vitamin D supplement to lower your risk for osteoporosis.  Menopause may have certain physical symptoms and risks.  Hormone replacement therapy may reduce some of these symptoms and risks. Talk to your health care provider about whether hormone replacement therapy is right for you. Follow these instructions at home:  Schedule regular health, dental, and eye exams.  Stay current with your immunizations.  Do not use any tobacco products including cigarettes, chewing tobacco, or electronic cigarettes.  If you are pregnant, do not drink alcohol.  If you are breastfeeding, limit how much and how often you drink alcohol.  Limit alcohol intake to no more than 1 drink per day for nonpregnant women. One drink equals 12 ounces of beer, 5 ounces of wine, or 1 ounces of hard liquor.  Do not use street drugs.  Do not share needles.  Ask your health care provider for help if you need support or information about quitting drugs.  Tell your health care provider if you often feel depressed.  Tell your health  care provider  if you have ever been abused or do not feel safe at home. This information is not intended to replace advice given to you by your health care provider. Make sure you discuss any questions you have with your health care provider. Document Released: 09/18/2010 Document Revised: 08/11/2015 Document Reviewed: 12/07/2014 Elsevier Interactive Patient Education  [employer].

## 2017-11-25 NOTE — Progress Notes (Signed)
Has open boil on vagina, has been treating it

## 2017-11-26 NOTE — Telephone Encounter (Signed)
Patient has been informed of mammogram appointment on 12/24/2017

## 2017-11-27 LAB — CYTOLOGY - PAP
DIAGNOSIS: NEGATIVE
HPV (WINDOPATH): NOT DETECTED

## 2017-12-02 ENCOUNTER — Telehealth: Payer: Self-pay

## 2017-12-02 MED ORDER — METRONIDAZOLE 500 MG PO TABS: 500.0000 mg | ORAL_TABLET | Freq: Two times a day (BID) | ORAL | 0 refills | Status: DC

## 2017-12-02 NOTE — Telephone Encounter (Addendum)
-----   Message from Chancy Milroy, MD sent at 11/28/2017  9:56 AM EDT ----- Please let pt know that she has trich Partner needs to be seen and evaluated Flagyl 500 mg po bid x 7 days TOC in 3-4 weeks after treatment completed Reframe from IC until [facility] Thanks Legrand [facility]o  Notified pt of results and the need to have her partner(s) treated as well.  I informed pt that an Rx of Flagyl has been sent to her [facility] on [address].  And to please call and schedule an appointment in 3-4 week for a nurse visit for TOC.  Pt stated understanding with no further questions.

## 2017-12-04 ENCOUNTER — Encounter: Payer: Self-pay | Admitting: *Deleted

## 2017-12-23 ENCOUNTER — Ambulatory Visit: Payer: 59 | Admitting: Obstetrics and Gynecology

## 2017-12-23 ENCOUNTER — Telehealth: Payer: Self-pay | Admitting: *Deleted

## 2017-12-23 NOTE — Telephone Encounter (Signed)
Called pt to inform her of missed appointment but she did not pick up and her voicemail is full.

## 2017-12-24 ENCOUNTER — Ambulatory Visit: Payer: 59

## 2018-09-26 IMAGING — US US ART/VEN ABD/PELV/SCROTUM DOPPLER LTD
2 series · 13 of 25 positions shown · non-contrast
Comparison: None.

CLINICAL DATA: 47-year-old with vaginal bleeding for 17 days and
right lower quadrant/right adnexal tenderness. Evaluate for
fibroids, ovarian cyst or torsion. LMP 09/25/2017.

EXAM:
TRANSABDOMINAL AND TRANSVAGINAL ULTRASOUND OF PELVIS
DOPPLER ULTRASOUND OF OVARIES
TECHNIQUE: Both transabdominal and transvaginal ultrasound examinations of the
pelvis were performed. Transabdominal technique was performed for
global imaging of the pelvis including uterus, ovaries, adnexal
regions, and pelvic cul-de-sac.
It was necessary to proceed with endovaginal exam following the
transabdominal exam to visualize the endometrium and ovaries to
better advantage.. Color and duplex Doppler ultrasound was utilized
to evaluate blood flow to the ovaries.

[Series 1: us art/ven abd/pelv/scrotum doppler ltd · 165 acquisitions, 12 frames shown (1 of 2)]
[im 1/165]
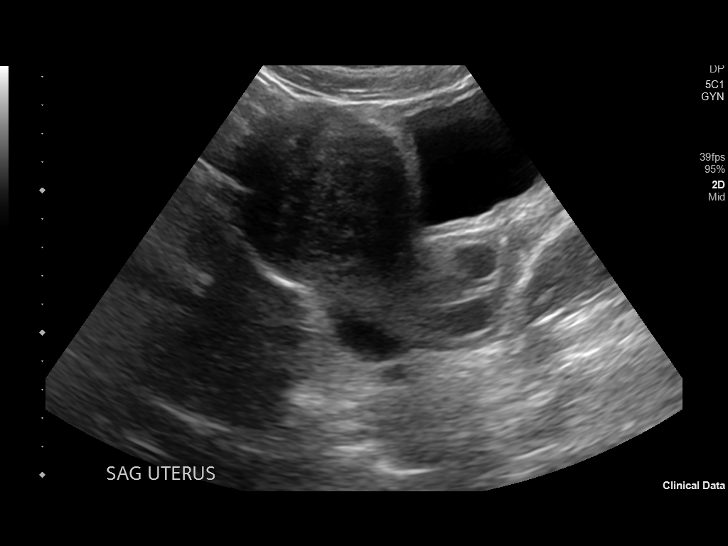
[im 15/165]
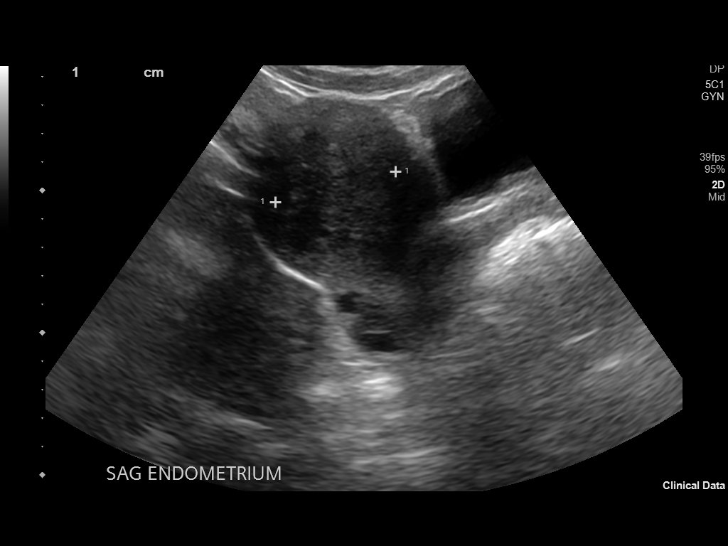
[im 29/165]
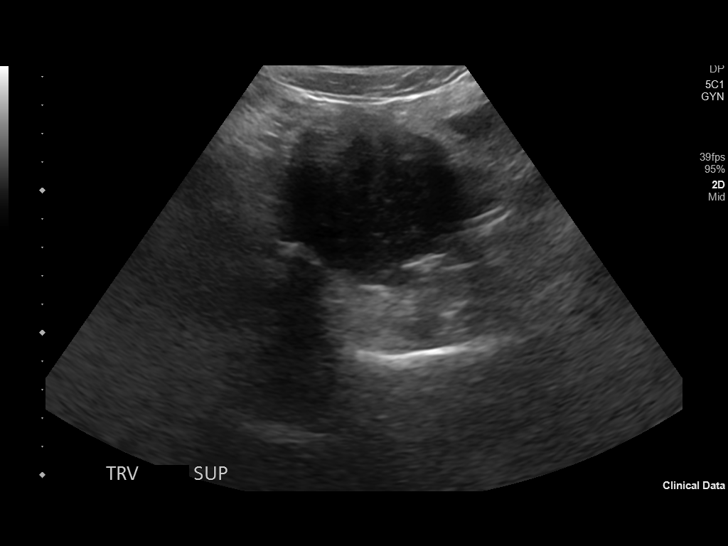
[im 43/165]
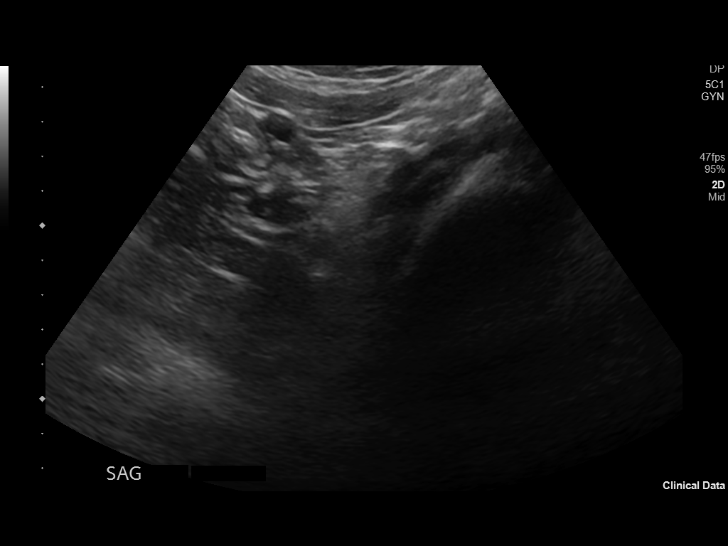
[im 58/165]
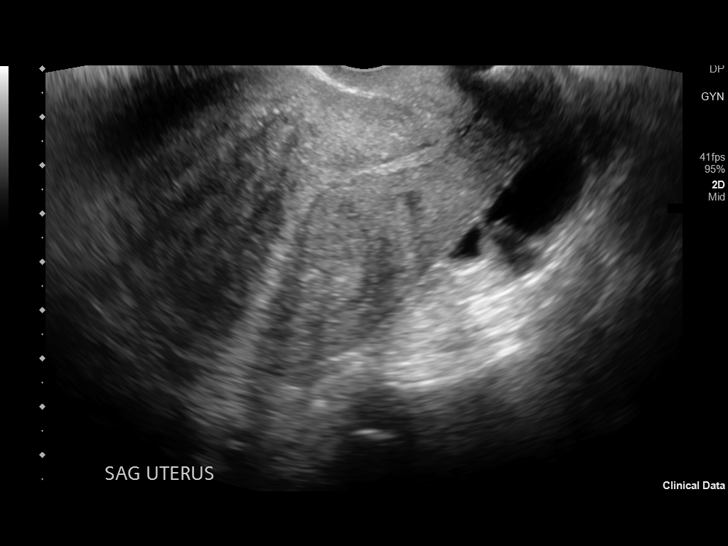
[im 72/165]
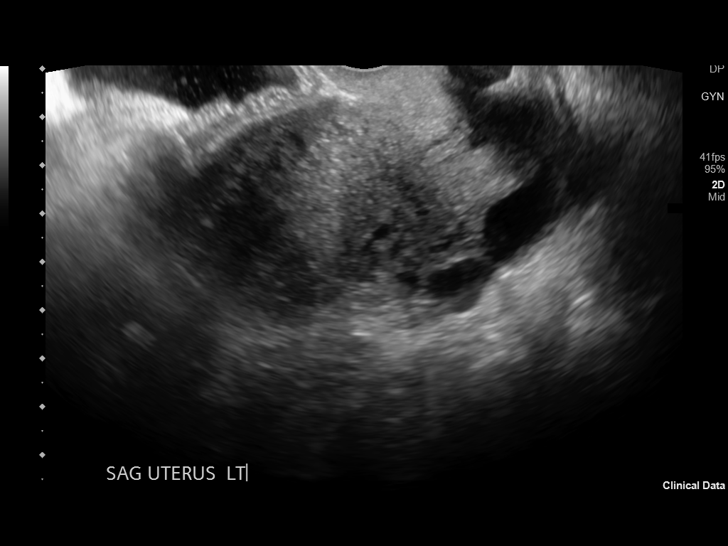
[im 86/165]
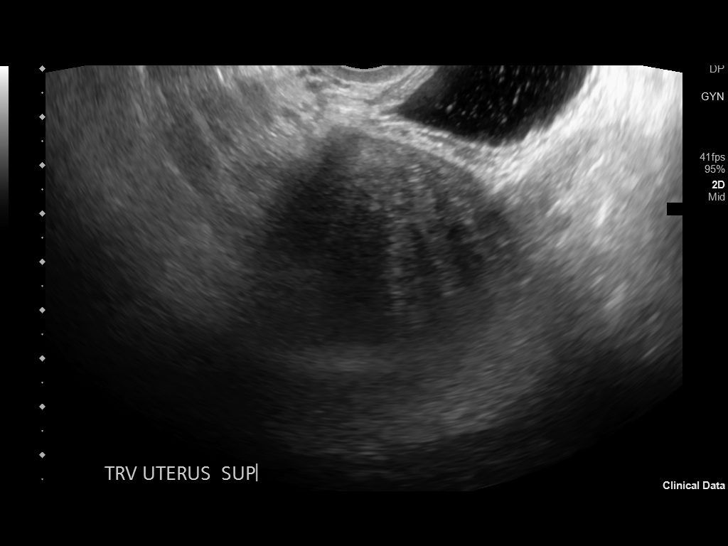
[im 100/165]
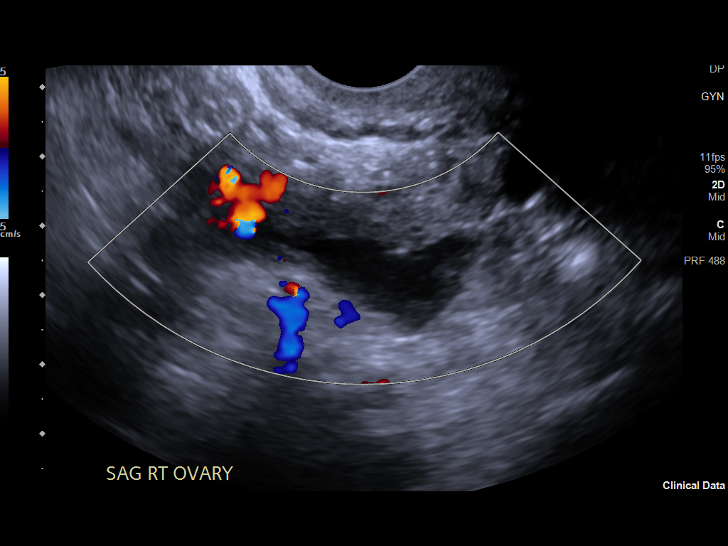
[im 115/165]
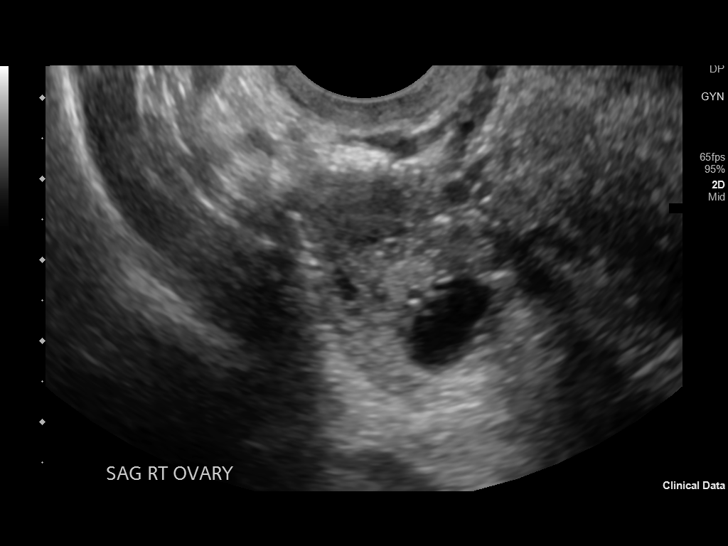
[im 129/165]
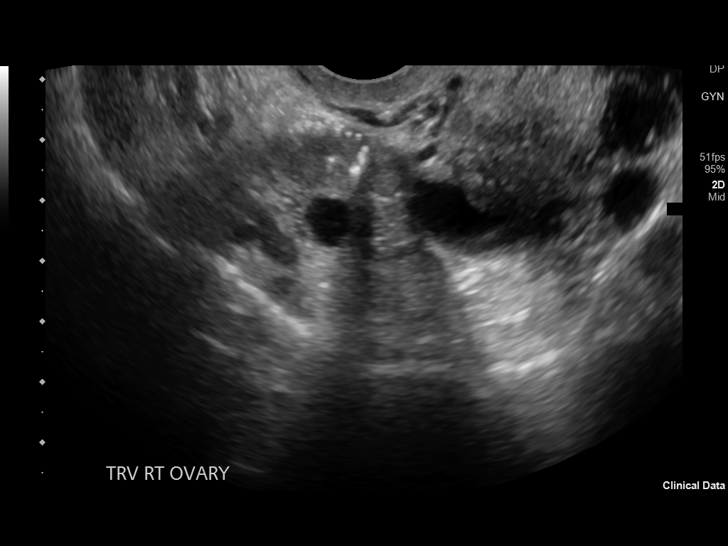
[im 143/165]
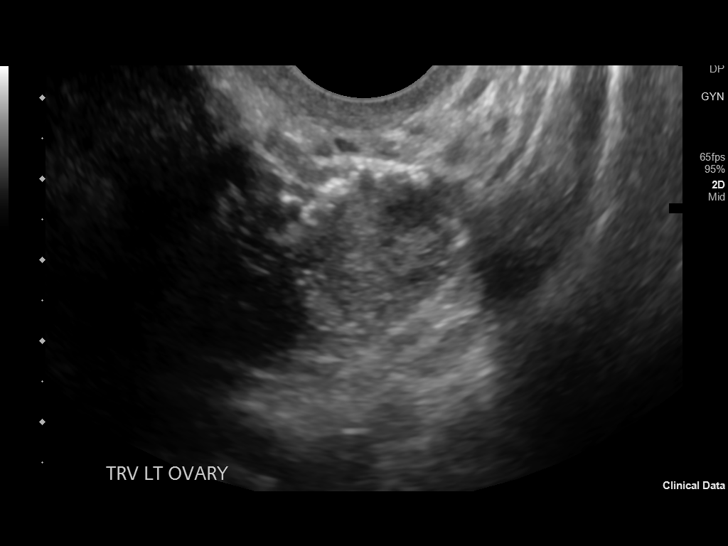
[im 157/165]
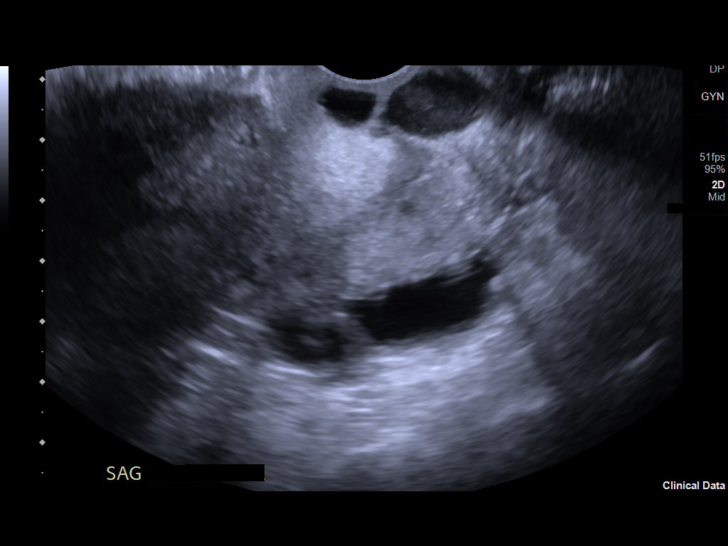

[Series 2: us art/ven abd/pelv/scrotum doppler ltd · 1 of 6 slices shown (2 of 2)]
[im 1/6]
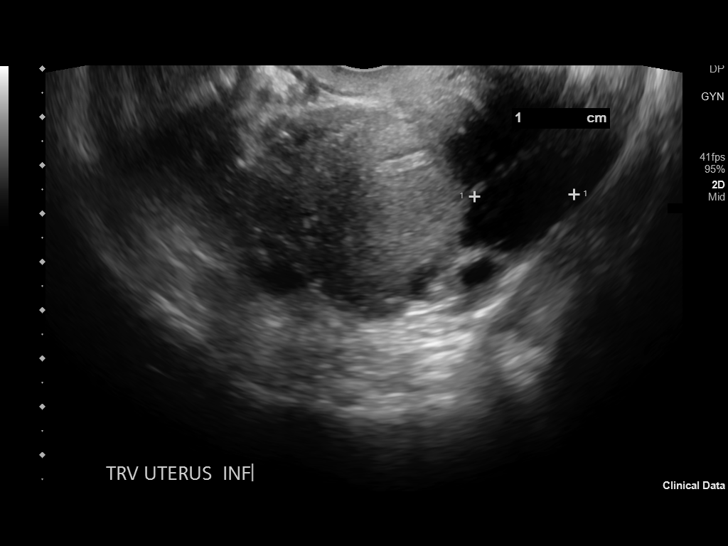

[13 of 25 positions shown; findings below may reference images not displayed]

FINDINGS: Uterus

Measurements: 11.3 x 6.4 x 6.7 cm. There is an ill-defined, slightly
echogenic structure in the fundal region centrally which is probably
a fibroid. This measures approximately 5.6 x 5.0 x 4.8 cm. The
endometrium is distorted in this area, and focal adenomyosis would
be a consideration.

Endometrium

Thickness: 4 mm. The endometrium is distorted in the fundal region
as noted above. Prominent cervical nabothian cysts.

Right ovary

Measurements: 3.1 x 2.2 x 2.1 cm. Probable small collapsing corpus
luteum. Normal blood flow with color Doppler.

Left ovary

Measurements: 2.8 x 1.8 x 1.9 cm. Normal blood flow with color
Doppler. Tubular cystic structure in the left adnexa measuring 3.7 x
1.5 x 2.1 cm, possibly a paraovarian cysts or dilated fallopian
tube.

Pulsed Doppler evaluation of both ovaries demonstrates normal
low-resistance arterial and venous waveforms.

Other findings

Small amount of free pelvic fluid.
IMPRESSION: 1. Central uterine fundal fibroid versus focal adenomyosis.
Resulting endometrial distortion without apparent thickening.
2. No evidence of ovarian torsion.
3. Possible left paraovarian cyst versus hydrosalpinx.
4. If the patient has persistent unexplained symptoms, consider
follow-up ultrasound or pelvic MRI for further evaluation.

## 2019-02-09 ENCOUNTER — Other Ambulatory Visit: Payer: Self-pay

## 2019-02-09 ENCOUNTER — Emergency Department (HOSPITAL_COMMUNITY): Payer: Self-pay

## 2019-02-09 ENCOUNTER — Emergency Department (HOSPITAL_COMMUNITY)
Admission: EM | Admit: 2019-02-09 | Discharge: 2019-02-10 | Disposition: A | Discharge status: Home / Self Care | Payer: Self-pay | Attending: Emergency Medicine | Admitting: Emergency Medicine

## 2019-02-09 ENCOUNTER — Encounter (HOSPITAL_COMMUNITY): Payer: Self-pay

## 2019-02-09 DIAGNOSIS — R0602 Shortness of breath: Secondary | ICD-10-CM | POA: Insufficient documentation

## 2019-02-09 DIAGNOSIS — Z20828 Contact with and (suspected) exposure to other viral communicable diseases: Secondary | ICD-10-CM | POA: Insufficient documentation

## 2019-02-09 DIAGNOSIS — F17210 Nicotine dependence, cigarettes, uncomplicated: Secondary | ICD-10-CM | POA: Insufficient documentation

## 2019-02-09 DIAGNOSIS — R531 Weakness: Secondary | ICD-10-CM | POA: Insufficient documentation

## 2019-02-09 DIAGNOSIS — R079 Chest pain, unspecified: Secondary | ICD-10-CM | POA: Insufficient documentation

## 2019-02-09 DIAGNOSIS — Z20822 Contact with and (suspected) exposure to covid-19: Secondary | ICD-10-CM

## 2019-02-09 DIAGNOSIS — R002 Palpitations: Secondary | ICD-10-CM | POA: Insufficient documentation

## 2019-02-09 LAB — BASIC METABOLIC PANEL
Anion gap: 7 (ref 5–15)
BUN: 11 mg/dL (ref 6–20)
CO2: 21 mmol/L — ABNORMAL LOW (ref 22–32)
Calcium: 9 mg/dL (ref 8.9–10.3)
Chloride: 110 mmol/L (ref 98–111)
Creatinine, Ser: 0.85 mg/dL (ref 0.44–1.00)
GFR calc Af Amer: >60 mL/min (ref >60)
GFR calc non Af Amer: >60 mL/min (ref >60)
Glucose, Bld: 109 mg/dL — ABNORMAL HIGH (ref 70–99)
Potassium: 4 mmol/L (ref 3.5–5.1)
Sodium: 138 mmol/L (ref 135–145)

## 2019-02-09 LAB — TROPONIN I (HIGH SENSITIVITY)
Troponin I (High Sensitivity): 11 ng/L (ref <18)
Troponin I (High Sensitivity): 11 ng/L (ref <18)

## 2019-02-09 LAB — CBC
HCT: 36.4 % (ref 36.0–46.0)
Hemoglobin: 11.3 g/dL — ABNORMAL LOW (ref 12.0–15.0)
MCH: 30.5 pg (ref 26.0–34.0)
MCHC: 31 g/dL (ref 30.0–36.0)
MCV: 98.4 fL (ref 80.0–100.0)
Platelets: 247 10*3/uL (ref 150–400)
RBC: 3.7 MIL/uL — ABNORMAL LOW (ref 3.87–5.11)
RDW: 17.4 % — ABNORMAL HIGH (ref 11.5–15.5)
WBC: 5.8 10*3/uL (ref 4.0–10.5)
nRBC: 0 % (ref 0.0–0.2)

## 2019-02-09 LAB — I-STAT BETA HCG BLOOD, ED (NOT ORDERABLE): I-stat hCG, quantitative: <5 m[IU]/mL (ref <5)

## 2019-02-09 MED ORDER — SODIUM CHLORIDE 0.9% FLUSH: 3.0000 mL | Freq: Once | INTRAVENOUS | Status: DC

## 2019-02-09 NOTE — ED Notes (Signed)
Patient endorses central chest pain that started over a week ago. The pain is intermittent and has occurred at rest and on exertion. Patient endorses shortness of breath with pain. She states that the pain feels like a sharp, throbbing pressure that "takes my breath away." Patient had same happen over a year ago. Patient lying in bed in NAD at this time. Patient states that pain and SOB have improved greatly.

## 2019-02-10 NOTE — ED Provider Notes (Signed)
[facility] DEPT Provider Note   CSN: [id number] Arrival date & time: 02/09/19  1806     History   Chief Complaint Chief Complaint  Patient presents with  . Irregular Heart Beat  . Weakness  . Shortness of Breath  . Chest Pain    HPI [name] is a 48 y.o. female.     HPI 48 year old female comes in a chief complaint of palpitations, weakness, chest pain, shortness of breath. She reports that she been having intermittent episodes of palpitations over the last 2 or 3 days.  She has had about 5 or 6 episodes of palpitations thus far which have been unprovoked and last for just a few seconds.  When she gets them she starts having chest pain.  She is also reporting worsening weakness over the last 2 or 3 days.  She was exposed to people who are having URI in a crowded space but does not know if they had COVID-19.  She is having no cough. Pt has no hx of PE, DVT and denies any exogenous hormone (testosterone / estrogen) use, long distance travels or surgery in the past 6 weeks, active cancer, recent immobilization.  She denies any stimulant use, substance abuse.  Past Medical History:  Diagnosis Date  . Abscess   . Ankle fracture   . History of breast abscess 2013  . Medical history non-contributory   . Rheumatic fever     Patient Active Problem List   Diagnosis Date Noted  . Fibroid 11/25/2017  . Anemia 11/25/2017  . Screening mammogram, encounter for 11/25/2017  . Abnormal uterine bleeding (AUB) 11/25/2017    Past Surgical History:  Procedure Laterality Date  . BREAST SURGERY    . INCISE AND DRAIN ABCESS     left breast abscess  . Incision & drainage left chest     left chest  . INCISION AND DRAINAGE OF WOUND N/A 09/06/2014   Procedure: IRRIGATION AND DEBRIDEMENT LEFT BREAST ABCESS;  Surgeon: Jackolyn Confer, MD;  Location: [facility];  Service: General;  Laterality: N/A;  . IRRIGATION AND DEBRIDEMENT ABSCESS Left 10/05/2013   Procedure: IRRIGATION AND DEBRIDEMENT left breast ABSCESS;  Surgeon: Luella Cook III, MD;  Location: [facility];  Service: General;  Laterality: Left;  . scar tissue removed       OB History    Gravida  0   Para  0   Term  0   Preterm  0   AB  0   Living  0     SAB  0   TAB  0   Ectopic  0   Multiple  0   Live Births  0            Home Medications    Prior to Admission medications   Medication Sig Start Date End Date Taking? Authorizing Provider  acetaminophen (TYLENOL) 500 MG tablet Take 1,000 mg by mouth daily as needed for moderate pain.   Yes [provider]  cephALEXin (KEFLEX) 500 MG capsule 2 caps po bid x 7 days Patient not taking: Reported on 02/09/2019 10/18/17   Street, [place], PA-C  Mefenamic Acid 250 MG CAPS Take 2 capsules (500 mg total) by mouth 3 (three) times daily. x4-5 days or until bleeding stops Patient not taking: Reported on 02/09/2019 10/18/17   Street, [place], PA-C  metroNIDAZOLE (FLAGYL) 500 MG tablet Take 1 tablet (500 mg total) by mouth 2 (two) times daily. Patient not taking: Reported on 02/09/2019  12/02/17   Chancy Milroy, MD    Family History Family History  Problem Relation Age of Onset  . Healthy Mother   . Healthy Father   . Heart disease Paternal Grandmother     Social History Social History   Tobacco Use  . Smoking status: Current Every Day Smoker    Packs/day: 0.50    Years: 28.00    Pack years: 14.00    Types: Cigarettes  . Smokeless tobacco: Never Used  Substance Use Topics  . Alcohol use: Yes    Comment: 1/2 bottle wine on weekends  . Drug use: No     Allergies   Hydrocodone   Review of Systems Review of Systems  Constitutional: Positive for fatigue.  Respiratory: Positive for shortness of breath.   Cardiovascular: Positive for chest pain.  Allergic/Immunologic: Negative for immunocompromised state.  All other systems reviewed and are negative.    Physical Exam Updated Vital Signs BP  109/78 (BP Location: Left Arm)   Pulse 69   Temp 98.6 F (37 C) (Oral)   Resp 20   Ht 5\' 6"  (1.676 m)   Wt 74.4 kg   LMP 02/09/2019   SpO2 100%   BMI 26.47 kg/m   Physical Exam Vitals signs and nursing note reviewed.  Constitutional:      Appearance: She is well-developed.  HENT:     Head: Normocephalic and atraumatic.  Neck:     Musculoskeletal: Normal range of motion and neck supple.  Cardiovascular:     Rate and Rhythm: Normal rate.  Pulmonary:     Effort: Pulmonary effort is normal.     Breath sounds: Normal breath sounds.  Abdominal:     General: Bowel sounds are normal.  Skin:    General: Skin is warm and dry.  Neurological:     Mental Status: She is alert and oriented to person, place, and time.     ED Treatments / Results  Labs (all labs ordered are listed, but only abnormal results are displayed) Labs Reviewed  BASIC METABOLIC PANEL - Abnormal; Notable for the following components:      Result Value   CO2 21 (*)    Glucose, Bld 109 (*)    All other components within normal limits  CBC - Abnormal; Notable for the following components:   RBC 3.70 (*)    Hemoglobin 11.3 (*)    RDW 17.4 (*)    All other components within normal limits  NOVEL CORONAVIRUS, NAA (HOSP ORDER, SEND-OUT TO REF LAB; TAT 18-24 HRS)  I-STAT BETA HCG BLOOD, ED ([facility], [facility], [facility] ONLY)  I-STAT BETA HCG BLOOD, ED (NOT ORDERABLE)  TROPONIN I (HIGH SENSITIVITY)  TROPONIN I (HIGH SENSITIVITY)    EKG EKG Interpretation  Date/Time:  Monday February 09 2019 18:39:12 EST Ventricular Rate:  77 PR Interval:    QRS Duration: 75 QT Interval:  387 QTC Calculation: 438 R Axis:   65 Text Interpretation: Sinus rhythm Anteroseptal infarct, age indeterminate No acute changes No old tracing to compare Confirmed by Varney Biles [phone]) on 02/09/2019 10:49:43 PM   Radiology Dg Chest 2 View  Result Date: 02/09/2019 CLINICAL DATA:  Chest pain and dyspnea EXAM: CHEST - 2 VIEW COMPARISON:   09/28/2009 chest radiograph. FINDINGS: Stable cardiomediastinal silhouette with normal heart size. No pneumothorax. No pleural effusion. Hyperinflated lungs. No pulmonary edema. No acute consolidative airspace disease. IMPRESSION: Hyperinflated lungs, suggesting obstructive lung disease. Otherwise no active disease in the chest. Electronically Signed   By:  Ilona Sorrel M.D.   On: 02/09/2019 19:11    Procedures Procedures (including critical care time)  Medications Ordered in ED Medications  sodium chloride flush (NS) 0.9 % injection 3 mL (3 mLs Intravenous Not Given 02/09/19 2249)     Initial Impression / Assessment and Plan / ED Course  I have reviewed the triage vital signs and the nursing notes.  Pertinent labs & imaging results that were available during my care of the patient were reviewed by me and considered in my medical decision making (see chart for details).        [name] was evaluated in Emergency Department on 02/10/2019 for the symptoms described in the history of present illness. She was evaluated in the context of the global COVID-19 pandemic, which necessitated consideration that the patient might be at risk for infection with the SARS-CoV-2 virus that causes COVID-19. Institutional protocols and algorithms that pertain to the evaluation of patients at risk for COVID-19 are in a state of rapid change based on information released by regulatory bodies including the CDC and federal and state organizations. These policies and algorithms were followed during the patient's care in the ED.   Patient comes in with multiple complaints.  Her primary complaint is palpitations.  Our telemetry monitoring did not reveal any arrhythmia.  Her EKG is reassuring.  She has no cardiac history.  She might benefit with Holter monitor as an outpatient as her symptoms are sounding convincing for some tachydysrhythmia.  Additionally, she is complaining of weakness and shortness of breath.   She might have had positive exposure.  COVID-19 test has been ordered.  Final Clinical Impressions(s) / ED Diagnoses   Final diagnoses:  Palpitations  Suspected COVID-19 virus infection  Generalized weakness    ED Discharge Orders    None       Varney Biles, MD 02/10/19 0139

## 2019-02-10 NOTE — Discharge Instructions (Signed)
All the results in the ER are normal, labs and imaging. We are not sure what is causing your symptoms. The workup in the ER is not complete, and is limited to screening for life threatening and emergent conditions only, so please see cardiology to see if they can provide Holter monitor.  Read the instructions of COVID-19.

## 2019-02-11 LAB — NOVEL CORONAVIRUS, NAA (HOSP ORDER, SEND-OUT TO REF LAB; TAT 18-24 HRS): SARS-CoV-2, NAA: NOT DETECTED

## 2020-01-18 IMAGING — CR DG CHEST 2V
2 series · 2 of 2 positions shown · non-contrast
Comparison: 09/28/2009 chest radiograph.

CLINICAL DATA: Chest pain and dyspnea

EXAM:
CHEST - 2 VIEW

[w chest pa]
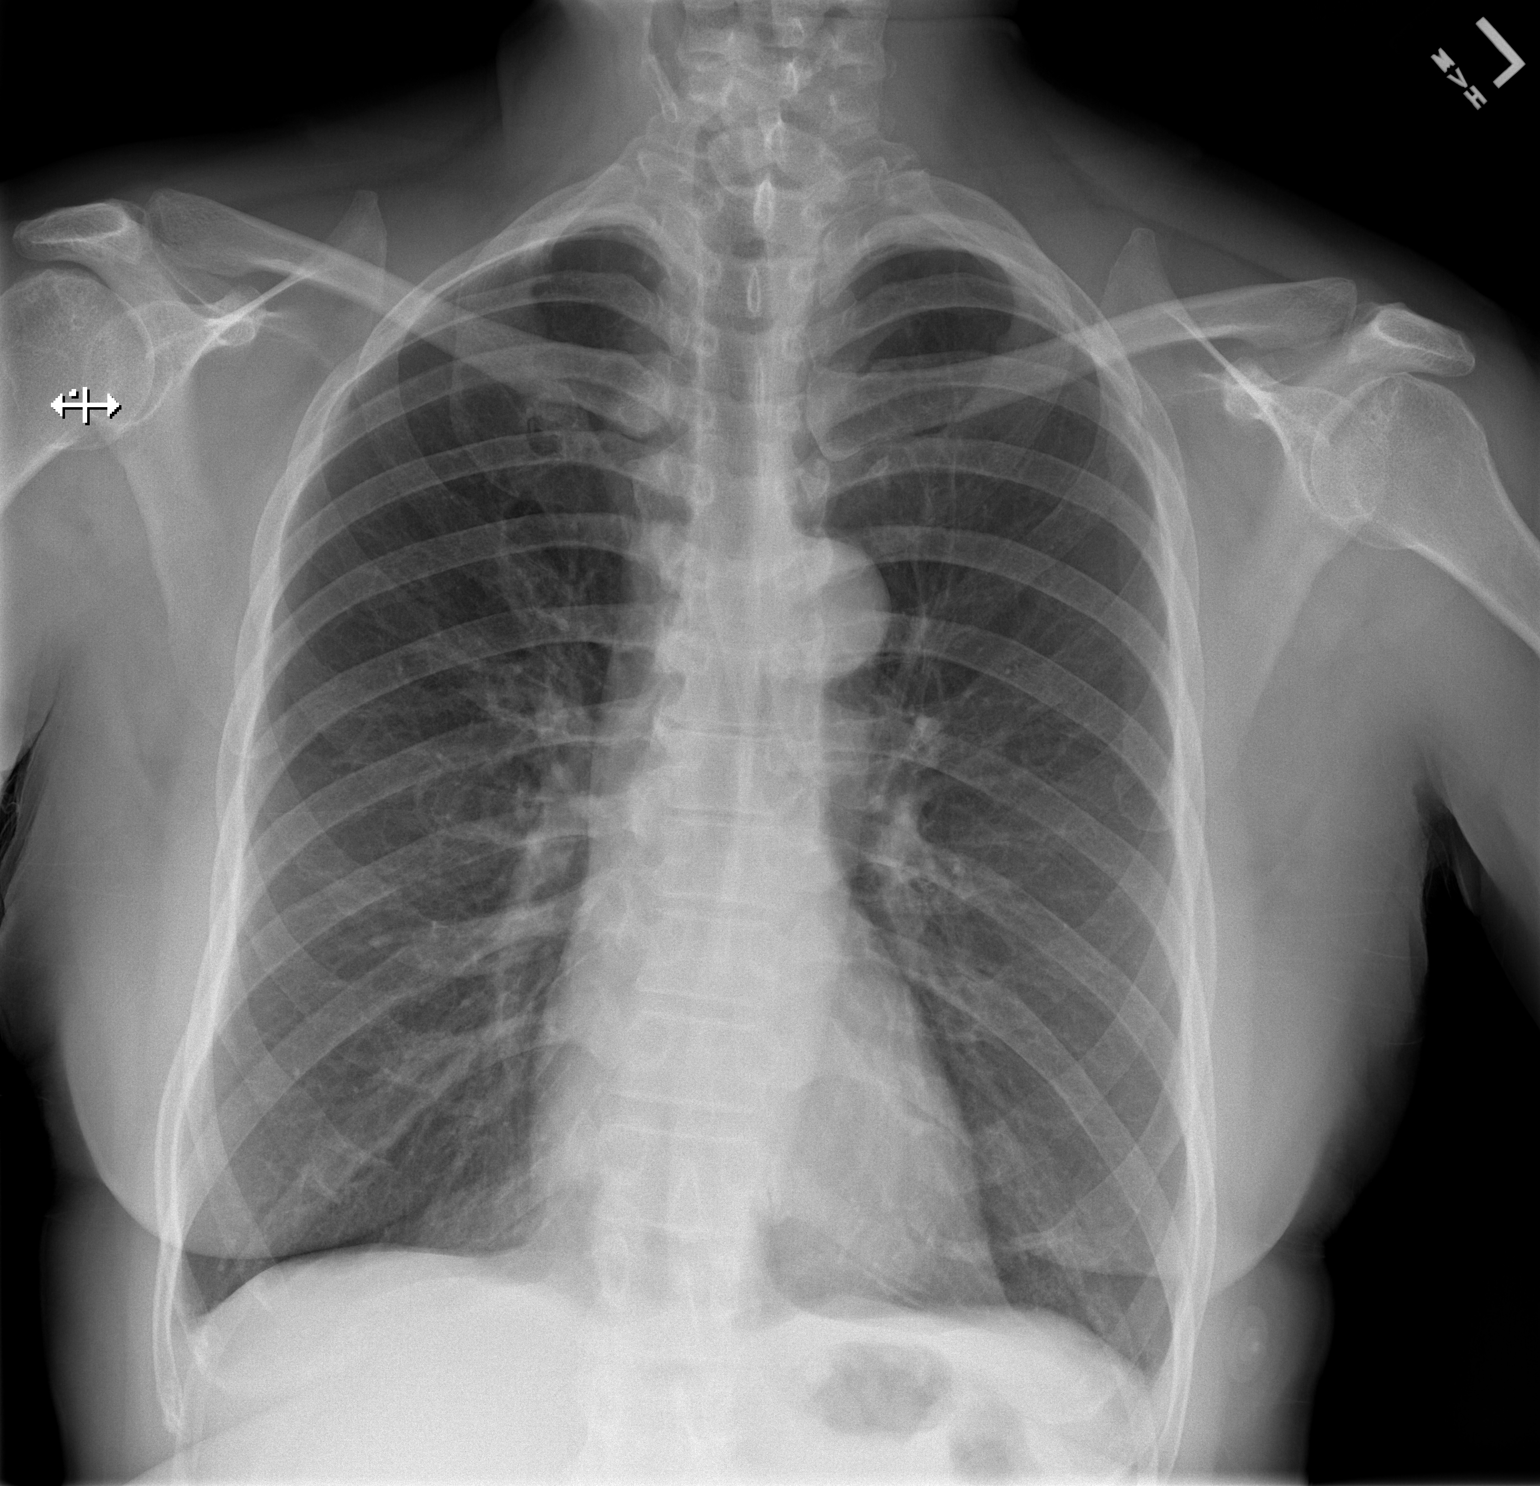

[w chest lat]
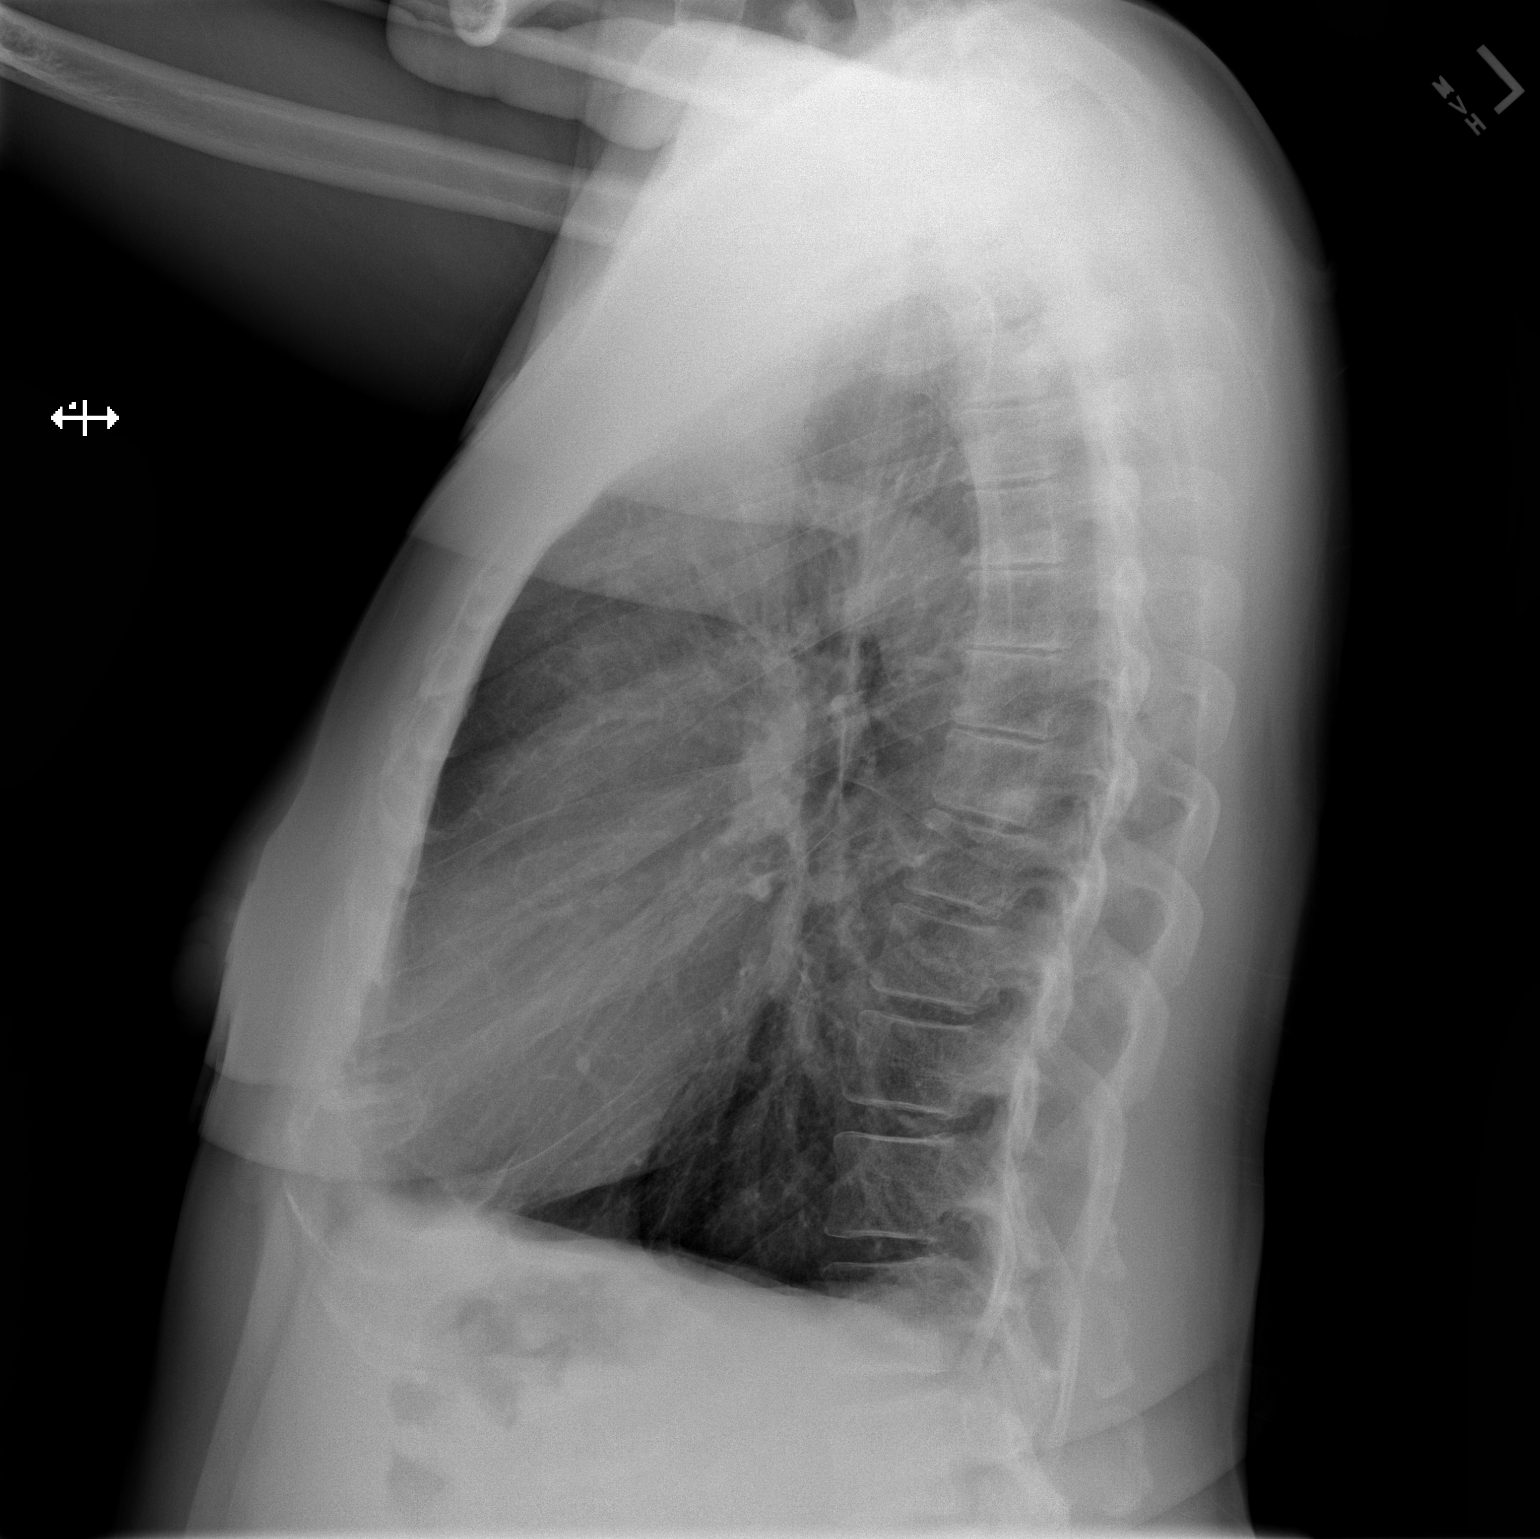

[2 of 2 positions shown; findings below may reference images not displayed]

FINDINGS: Stable cardiomediastinal silhouette with normal heart size. No
pneumothorax. No pleural effusion. Hyperinflated lungs. No pulmonary
edema. No acute consolidative airspace disease.
IMPRESSION: Hyperinflated lungs, suggesting obstructive lung disease. Otherwise
no active disease in the chest.

## 2023-06-20 ENCOUNTER — Ambulatory Visit
Admission: EM | Admit: 2023-06-20 | Discharge: 2023-06-20 | Disposition: A | Discharge status: Home / Self Care | Attending: Family Medicine | Admitting: Family Medicine

## 2023-06-20 DIAGNOSIS — L02419 Cutaneous abscess of limb, unspecified: Secondary | ICD-10-CM | POA: Diagnosis not present

## 2023-06-20 MED ORDER — DOXYCYCLINE HYCLATE 100 MG PO CAPS: 100.0000 mg | ORAL_CAPSULE | Freq: Two times a day (BID) | ORAL | 0 refills | Status: DC

## 2023-06-20 MED ORDER — NAPROXEN 500 MG PO TABS: 500.0000 mg | ORAL_TABLET | Freq: Two times a day (BID) | ORAL | 0 refills | Status: DC

## 2023-06-20 NOTE — ED Triage Notes (Signed)
 Pt c/o left axilla abscess x 8 days-NAD-steady gait

## 2023-06-20 NOTE — ED Provider Notes (Signed)
 [facility]RG[facility]  Note:  This document was prepared using Conservation officer, historic buildings and may include unintentional dictation errors.  MRN: [id number] DOB: 02/12/1971  Subjective:   [name] is a 53 y.o. female presenting for 8-day history of recurrent left axilla abscess.  Patient has tried to get it to drain.  But has been unsuccessful.  Has a history of recurrent abscesses.  No fever, chest pain, nausea, vomiting.  No current facility-administered medications for this encounter.  Current Outpatient Medications:    amoxicillin (AMOXIL) 500 MG tablet, Take 500 mg by mouth 4 (four) times daily., Disp: , Rfl:    acetaminophen (TYLENOL) 500 MG tablet, Take 1,000 mg by mouth daily as needed for moderate pain., Disp: , Rfl:    cephALEXin (KEFLEX) 500 MG capsule, 2 caps po bid x 7 days (Patient not taking: Reported on 02/09/2019), Disp: 28 capsule, Rfl: 0   Mefenamic Acid 250 MG CAPS, Take 2 capsules (500 mg total) by mouth 3 (three) times daily. x4-5 days or until bleeding stops (Patient not taking: Reported on 02/09/2019), Disp: 30 each, Rfl: 0   metroNIDAZOLE (FLAGYL) 500 MG tablet, Take 1 tablet (500 mg total) by mouth 2 (two) times daily. (Patient not taking: Reported on 02/09/2019), Disp: 14 tablet, Rfl: 0   Allergies  Allergen Reactions   Hydrocodone Itching    Pt tolerates oxycodone    Past Medical History:  Diagnosis Date   Abscess    Ankle fracture    History of breast abscess 2013   Medical history non-contributory    Rheumatic fever      Past Surgical History:  Procedure Laterality Date   BREAST SURGERY     INCISE AND DRAIN ABCESS     left breast abscess   Incision & drainage left chest     left chest   INCISION AND DRAINAGE OF WOUND N/A 09/06/2014   Procedure: IRRIGATION AND DEBRIDEMENT LEFT BREAST ABCESS;  Surgeon: Avel Peace, MD;  Location: [facility];  Service: General;  Laterality: N/A;   IRRIGATION AND DEBRIDEMENT ABSCESS Left  10/05/2013   Procedure: IRRIGATION AND DEBRIDEMENT left breast ABSCESS;  Surgeon: Robyne Askew, MD;  Location: [facility];  Service: General;  Laterality: Left;   scar tissue removed      Family History  Problem Relation Age of Onset   Healthy Mother    Healthy Father    Heart disease Paternal Grandmother     Social History   Tobacco Use   Smoking status: Every Day    Current packs/day: 0.50    Average packs/day: 0.5 packs/day for 28.0 years (14.0 ttl pk-yrs)    Types: Cigarettes   Smokeless tobacco: Never  Vaping Use   Vaping status: Never Used  Substance Use Topics   Alcohol use: Yes    Comment: occ   Drug use: No    ROS   Objective:   Vitals: BP 115/88 (BP Location: Right Arm)   Pulse 94   Temp 97.8 F (36.6 C) (Oral)   Resp 20   LMP 02/09/2019   SpO2 93%   Physical Exam Constitutional:      General: She is not in acute distress.    Appearance: Normal appearance. She is well-developed. She is not ill-appearing, toxic-appearing or diaphoretic.  HENT:     Head: Normocephalic and atraumatic.     Nose: Nose normal.     Mouth/Throat:     Mouth: Mucous membranes are moist.  Eyes:  General: No scleral icterus.       Right eye: No discharge.        Left eye: No discharge.     Extraocular Movements: Extraocular movements intact.  Cardiovascular:     Rate and Rhythm: Normal rate.  Pulmonary:     Effort: Pulmonary effort is normal.  Skin:    General: Skin is warm and dry.       Neurological:     General: No focal deficit present.     Mental Status: She is alert and oriented to person, place, and time.  Psychiatric:        Mood and Affect: Mood normal.        Behavior: Behavior normal.    PROCEDURE NOTE: I&D of Abscess Verbal consent obtained. Local anesthesia with 8cc of 2% lidocaine with epinephrine. Site cleansed with Betadine and alcohol swabs. Incision of 1/2cm was made using an 11 blade, 5cc expressed consisting of a mixture of pus and  serosanguinous fluid. Wound cavity was explored with curved hemostats and loculations loosened. Cleansed and dressed.   Assessment and Plan :   PDMP not reviewed this encounter.  1. Axillary abscess    Successful I&D performed.  Wound care reviewed.  Start doxycycline for the abscess, naproxen for pain and inflammation. Counseled patient on potential for adverse effects with medications prescribed/recommended today, ER and return-to-clinic precautions discussed, patient verbalized understanding.    Wallis Bamberg, PA-C 06/20/23 1324

## 2023-06-20 NOTE — Discharge Instructions (Signed)
 Please change your dressing 3-5 times daily. Do not apply any ointments or creams. Each time you change your dressing, make sure that you are pressing on the wound to get pus to come out.  Try your best to clean the wound with antibacterial soap and warm water. Pat your wound dry and let it air out if possible to make sure it is dry before reapplying another dressing.   Start doxycycline for the infection. Use naproxen for the pain.

## 2023-08-14 ENCOUNTER — Other Ambulatory Visit: Payer: Self-pay

## 2023-08-14 ENCOUNTER — Encounter (HOSPITAL_COMMUNITY): Payer: Self-pay | Admitting: Oral Surgery

## 2023-08-14 NOTE — Progress Notes (Signed)
 SDW call  Patient was given pre-op instructions over the phone. Patient verbalized understanding of instructions provided.     PCP - Denies, states she just uses Urgent care when she needs a Dr.  Development worker, international aid -  Pulmonary:    PPM/ICD - denies Device Orders - na Rep Notified - na   Chest x-ray - na EKG -  na Stress Test - ECHO -  Cardiac Cath -   Sleep Study/sleep apnea/CPAP: denies  Non-diabetic   Blood Thinner Instructions: denies Aspirin Instructions:denies  ERAS Protcol - NPO  Anesthesia review: Yes   Patient denies shortness of breath, fever, cough and chest pain over the phone call  Your procedure is scheduled on Friday Aug 16, 2023  Report to [facility] Main Entrance "A" at  0530  A.M., then check in with the Admitting office.  Call this number if you have problems the morning of surgery:  [phone]   If you have any questions prior to your surgery date call [phone]: Open Monday-Friday 8am-4pm If you experience any cold or flu symptoms such as cough, fever, chills, shortness of breath, etc. between now and your scheduled surgery, please notify us  at the above number    Remember:  Do not eat or drink after midnight the night before your surgery  Take these medicines if needed the morning of surgery with A SIP OF WATER:  Tylenol   As of today, STOP taking any Aspirin (unless otherwise instructed by your surgeon) Aleve , Naproxen , Ibuprofen , Motrin , Advil , Goody's, BC's, all herbal medications, fish oil, and all vitamins.

## 2023-08-14 NOTE — H&P (Signed)
 Date: Aug 05, 2023   Patient: [name]   PID: [id number]  DOB: 09-Jun-1970  SEX: Female   Patient referred by DDS for extraction teeth # 1-16, 18, 31, 32   CC: Pain, hurts when I eat.  Past Medical History:  chronic cough, Smoker, Obese    Medications: None    Allergies:     NKDA    Surgeries:   Breast biopsy     Social History       Smoking:            Alcohol: Drug use:                             Exam: BMI 30. Multiple caries/bone loss teeth # 1, 2, 3, 4, 5, 6, 7, 8, 9, 10, 11, 12, 13, 14, 15, 16, 31, 32.   No purulence, edema, fluctuance, trismus. Oral cancer screening negative. Pharynx clear. No lymphadenopathy.  Panorex:Multiple caries/bone loss teeth # 1, 2, 3, 4, 5, 6, 7, 8, 9, 10, 11, 12, 13, 14, 15, 16, 31, 32.     Assessment: ASA . Non-restorable  teeth # 1, 2, 3, 4, 5, 6, 7, 8, 9, 10, 11, 12, 13, 14, 15, 16, 31, 32.                Plan: 1.  Extraction Teeth # 1, 2, 3, 4, 5, 6, 7, 8, 9, 10, 11, 12, 13, 14, 15, 16, 18, 31, 32.     Hospital Day surgery.                 Rx: n               Risks and complications explained. Questions answered.   Cornelia Dieter, DMD

## 2023-08-15 NOTE — Anesthesia Preprocedure Evaluation (Signed)
 Anesthesia Evaluation  Patient identified by MRN, date of birth, ID band Patient awake  General Assessment Comment:DENTAL CARIES  Reviewed: Allergy & Precautions, NPO status , Patient's Chart, lab work & pertinent test results  Airway Mallampati: II  TM Distance: >3 FB Neck ROM: Full    Dental  (+) Dental Advisory Given, Poor Dentition   Pulmonary Current SmokerPatient did not abstain from smoking.   Pulmonary exam normal breath sounds clear to auscultation       Cardiovascular Exercise Tolerance: Good negative cardio ROS Normal cardiovascular exam Rhythm:Regular Rate:Normal     Neuro/Psych negative neurological ROS  negative psych ROS   GI/Hepatic negative GI ROS, Neg liver ROS,,,  Endo/Other  negative endocrine ROS    Renal/GU negative Renal ROS     Musculoskeletal negative musculoskeletal ROS (+)    Abdominal   Peds  Hematology negative hematology ROS (+)   Anesthesia Other Findings   Reproductive/Obstetrics                             Anesthesia Physical Anesthesia Plan  ASA: 2  Anesthesia Plan: General   Post-op Pain Management: Tylenol  PO (pre-op)*   Induction: Intravenous  PONV Risk Score and Plan: 2 and Midazolam , Dexamethasone  and Ondansetron   Airway Management Planned: Nasal ETT  Additional Equipment:   Intra-op Plan:   Post-operative Plan: Extubation in OR  Informed Consent: I have reviewed the patients History and Physical, chart, labs and discussed the procedure including the risks, benefits and alternatives for the proposed anesthesia with the patient or authorized representative who has indicated his/her understanding and acceptance.     Dental advisory given  Plan Discussed with: CRNA  Anesthesia Plan Comments:         Anesthesia Quick Evaluation

## 2023-08-16 ENCOUNTER — Ambulatory Visit (HOSPITAL_COMMUNITY)
Admission: RE | Admit: 2023-08-16 | Discharge: 2023-08-16 | Disposition: A | Discharge status: Home / Self Care | Attending: Oral Surgery | Admitting: Oral Surgery

## 2023-08-16 ENCOUNTER — Encounter (HOSPITAL_COMMUNITY): Payer: Self-pay | Admitting: Oral Surgery

## 2023-08-16 ENCOUNTER — Ambulatory Visit (HOSPITAL_COMMUNITY): Payer: Self-pay | Admitting: Anesthesiology

## 2023-08-16 ENCOUNTER — Encounter (HOSPITAL_COMMUNITY)
Admission: RE | Disposition: A | Discharge status: Home / Self Care | Payer: Self-pay | Source: Home / Self Care | Attending: Oral Surgery

## 2023-08-16 ENCOUNTER — Other Ambulatory Visit: Payer: Self-pay

## 2023-08-16 DIAGNOSIS — F17200 Nicotine dependence, unspecified, uncomplicated: Secondary | ICD-10-CM | POA: Diagnosis not present

## 2023-08-16 DIAGNOSIS — K0850 Unsatisfactory restoration of tooth, unspecified: Secondary | ICD-10-CM

## 2023-08-16 DIAGNOSIS — K029 Dental caries, unspecified: Secondary | ICD-10-CM | POA: Insufficient documentation

## 2023-08-16 HISTORY — PX: TOOTH EXTRACTION: SHX859

## 2023-08-16 LAB — CBC
HCT: 46.7 % — ABNORMAL HIGH (ref 36.0–46.0)
Hemoglobin: 15.4 g/dL — ABNORMAL HIGH (ref 12.0–15.0)
MCH: 31.1 pg (ref 26.0–34.0)
MCHC: 33 g/dL (ref 30.0–36.0)
MCV: 94.3 fL (ref 80.0–100.0)
Platelets: 163 10*3/uL (ref 150–400)
RBC: 4.95 MIL/uL (ref 3.87–5.11)
RDW: 14 % (ref 11.5–15.5)
WBC: 5.9 10*3/uL (ref 4.0–10.5)
nRBC: 0 % (ref 0.0–0.2)

## 2023-08-16 LAB — BASIC METABOLIC PANEL WITH GFR
Anion gap: 8 (ref 5–15)
BUN: 12 mg/dL (ref 6–20)
CO2: 22 mmol/L (ref 22–32)
Calcium: 8.7 mg/dL — ABNORMAL LOW (ref 8.9–10.3)
Chloride: 108 mmol/L (ref 98–111)
Creatinine, Ser: 0.84 mg/dL (ref 0.44–1.00)
GFR, Estimated: >60 mL/min (ref >60)
Glucose, Bld: 119 mg/dL — ABNORMAL HIGH (ref 70–99)
Potassium: 4.1 mmol/L (ref 3.5–5.1)
Sodium: 138 mmol/L (ref 135–145)

## 2023-08-16 LAB — POCT PREGNANCY, URINE: Preg Test, Ur: NEGATIVE

## 2023-08-16 SURGERY — DENTAL RESTORATION/EXTRACTIONS
Anesthesia: General

## 2023-08-16 MED ORDER — ONDANSETRON HCL 4 MG/2ML IJ SOLN: 4.0000 mg | Freq: Once | INTRAMUSCULAR | Status: DC | PRN

## 2023-08-16 MED ORDER — PROPOFOL 10 MG/ML IV BOLUS
INTRAVENOUS | Status: AC
  Filled 2023-08-16: qty 20

## 2023-08-16 MED ORDER — DEXAMETHASONE SODIUM PHOSPHATE 10 MG/ML IJ SOLN
INTRAMUSCULAR | Status: DC | PRN
  Administered 2023-08-16: 10 mg via INTRAVENOUS

## 2023-08-16 MED ORDER — HYDROMORPHONE HCL 1 MG/ML IJ SOLN
INTRAMUSCULAR | Status: DC | PRN
  Administered 2023-08-16: .5 mg via INTRAVENOUS

## 2023-08-16 MED ORDER — LIDOCAINE-EPINEPHRINE 1 %-1:100000 IJ SOLN
INTRAMUSCULAR | Status: DC | PRN
  Administered 2023-08-16: 20 mL

## 2023-08-16 MED ORDER — AMOXICILLIN 500 MG PO CAPS: 500.0000 mg | ORAL_CAPSULE | Freq: Three times a day (TID) | ORAL | 0 refills | Status: AC

## 2023-08-16 MED ORDER — LACTATED RINGERS IV SOLN: INTRAVENOUS | Status: DC

## 2023-08-16 MED ORDER — FENTANYL CITRATE (PF) 250 MCG/5ML IJ SOLN
INTRAMUSCULAR | Status: DC | PRN
Start: 2023-08-16 — End: 2023-08-16
  Administered 2023-08-16: 100 ug via INTRAVENOUS
  Administered 2023-08-16: 50 ug via INTRAVENOUS
  Administered 2023-08-16: 100 ug via INTRAVENOUS

## 2023-08-16 MED ORDER — SUGAMMADEX SODIUM 200 MG/2ML IV SOLN
INTRAVENOUS | Status: DC | PRN
  Administered 2023-08-16: 200 mg via INTRAVENOUS

## 2023-08-16 MED ORDER — LIDOCAINE 2% (20 MG/ML) 5 ML SYRINGE
INTRAMUSCULAR | Status: DC | PRN
  Administered 2023-08-16: 80 mg via INTRAVENOUS

## 2023-08-16 MED ORDER — HYDROMORPHONE HCL 1 MG/ML IJ SOLN
INTRAMUSCULAR | Status: AC
  Filled 2023-08-16: qty 0.5

## 2023-08-16 MED ORDER — ONDANSETRON HCL 4 MG/2ML IJ SOLN
INTRAMUSCULAR | Status: DC | PRN
  Administered 2023-08-16: 4 mg via INTRAVENOUS

## 2023-08-16 MED ORDER — FENTANYL CITRATE (PF) 250 MCG/5ML IJ SOLN
INTRAMUSCULAR | Status: AC
  Filled 2023-08-16: qty 5

## 2023-08-16 MED ORDER — 0.9 % SODIUM CHLORIDE (POUR BTL) OPTIME
TOPICAL | Status: DC | PRN
Start: 2023-08-16 — End: 2023-08-16
  Administered 2023-08-16: 1000 mL

## 2023-08-16 MED ORDER — ACETAMINOPHEN 500 MG PO TABS
ORAL_TABLET | ORAL | Status: AC
  Filled 2023-08-16: qty 2

## 2023-08-16 MED ORDER — FENTANYL CITRATE (PF) 100 MCG/2ML IJ SOLN
INTRAMUSCULAR | Status: AC
  Filled 2023-08-16: qty 2

## 2023-08-16 MED ORDER — PROPOFOL 10 MG/ML IV BOLUS
INTRAVENOUS | Status: DC | PRN
  Administered 2023-08-16: 130 mg via INTRAVENOUS
  Administered 2023-08-16: 30 mg via INTRAVENOUS

## 2023-08-16 MED ORDER — SODIUM CHLORIDE 0.9 % IR SOLN
Status: DC | PRN
  Administered 2023-08-16: 1000 mL

## 2023-08-16 MED ORDER — CHLORHEXIDINE GLUCONATE 0.12 % MT SOLN
OROMUCOSAL | Status: AC
  Filled 2023-08-16: qty 15

## 2023-08-16 MED ORDER — ROCURONIUM BROMIDE 10 MG/ML (PF) SYRINGE
PREFILLED_SYRINGE | INTRAVENOUS | Status: DC | PRN
  Administered 2023-08-16: 50 mg via INTRAVENOUS

## 2023-08-16 MED ORDER — CEFAZOLIN SODIUM-DEXTROSE 2-4 GM/100ML-% IV SOLN
2.0000 g | INTRAVENOUS | Status: AC
  Administered 2023-08-16: 2 g via INTRAVENOUS

## 2023-08-16 MED ORDER — MIDAZOLAM HCL 2 MG/2ML IJ SOLN
INTRAMUSCULAR | Status: DC | PRN
  Administered 2023-08-16: 2 mg via INTRAVENOUS

## 2023-08-16 MED ORDER — CEFAZOLIN SODIUM-DEXTROSE 2-4 GM/100ML-% IV SOLN
INTRAVENOUS | Status: AC
  Filled 2023-08-16: qty 100

## 2023-08-16 MED ORDER — LIDOCAINE-EPINEPHRINE 1 %-1:100000 IJ SOLN
INTRAMUSCULAR | Status: AC
  Filled 2023-08-16: qty 1

## 2023-08-16 MED ORDER — ACETAMINOPHEN 500 MG PO TABS: 1000.0000 mg | ORAL_TABLET | Freq: Once | ORAL | Status: DC

## 2023-08-16 MED ORDER — DEXMEDETOMIDINE HCL IN NACL 80 MCG/20ML IV SOLN
INTRAVENOUS | Status: DC | PRN
  Administered 2023-08-16: 8 ug via INTRAVENOUS

## 2023-08-16 MED ORDER — LIDOCAINE-EPINEPHRINE 2 %-1:100000 IJ SOLN: INTRAMUSCULAR | Status: DC | PRN

## 2023-08-16 MED ORDER — OXYCODONE-ACETAMINOPHEN 5-325 MG PO TABS: 1.0000 | ORAL_TABLET | ORAL | 0 refills | Status: AC | PRN

## 2023-08-16 MED ORDER — AMISULPRIDE (ANTIEMETIC) 5 MG/2ML IV SOLN: 10.0000 mg | Freq: Once | INTRAVENOUS | Status: DC | PRN

## 2023-08-16 MED ORDER — LACTATED RINGERS IV SOLN
INTRAVENOUS | Status: DC | PRN
Start: 2023-08-16 — End: 2023-08-16

## 2023-08-16 MED ORDER — ORAL CARE MOUTH RINSE: 15.0000 mL | Freq: Once | OROMUCOSAL | Status: DC

## 2023-08-16 MED ORDER — MIDAZOLAM HCL 2 MG/2ML IJ SOLN
INTRAMUSCULAR | Status: AC
  Filled 2023-08-16: qty 2

## 2023-08-16 MED ORDER — FENTANYL CITRATE (PF) 100 MCG/2ML IJ SOLN
25.0000 ug | INTRAMUSCULAR | Status: DC | PRN
  Administered 2023-08-16: 50 ug via INTRAVENOUS

## 2023-08-16 MED ORDER — CHLORHEXIDINE GLUCONATE 0.12 % MT SOLN: 15.0000 mL | Freq: Once | OROMUCOSAL | Status: DC

## 2023-08-16 MED ORDER — OXYMETAZOLINE HCL 0.05 % NA SOLN
NASAL | Status: AC
  Filled 2023-08-16: qty 30

## 2023-08-16 MED ORDER — PHENYLEPHRINE 80 MCG/ML (10ML) SYRINGE FOR IV PUSH (FOR BLOOD PRESSURE SUPPORT)
PREFILLED_SYRINGE | INTRAVENOUS | Status: DC | PRN
  Administered 2023-08-16 (×2): 160 ug via INTRAVENOUS

## 2023-08-16 SURGICAL SUPPLY — 26 items
BAG COUNTER SPONGE SURGICOUNT (BAG) IMPLANT
BLADE SURG 15 STRL LF DISP TIS (BLADE) ×2 IMPLANT
BUR CROSS CUT FISSURE 1.6 (BURR) ×2 IMPLANT
BUR EGG ELITE 4.0 (BURR) IMPLANT
CANISTER SUCTION 3000ML PPV (SUCTIONS) ×2 IMPLANT
COVER SURGICAL LIGHT HANDLE (MISCELLANEOUS) ×2 IMPLANT
GAUZE PACKING FOLDED 2 STR (GAUZE/BANDAGES/DRESSINGS) ×2 IMPLANT
GLOVE BIO SURGEON STRL SZ8 (GLOVE) ×2 IMPLANT
GOWN STRL REUS W/ TWL LRG LVL3 (GOWN DISPOSABLE) ×2 IMPLANT
GOWN STRL REUS W/ TWL XL LVL3 (GOWN DISPOSABLE) ×2 IMPLANT
IV NS 1000ML BAXH (IV SOLUTION) ×2 IMPLANT
KIT BASIN OR (CUSTOM PROCEDURE TRAY) ×2 IMPLANT
KIT TURNOVER KIT B (KITS) ×2 IMPLANT
NDL HYPO 25GX1X1/2 BEV (NEEDLE) ×4 IMPLANT
NEEDLE HYPO 25GX1X1/2 BEV (NEEDLE) ×2 IMPLANT
NS IRRIG 1000ML POUR BTL (IV SOLUTION) ×2 IMPLANT
PAD ARMBOARD POSITIONER FOAM (MISCELLANEOUS) ×2 IMPLANT
SLEEVE IRRIGATION ELITE 7 (MISCELLANEOUS) ×2 IMPLANT
SPIKE FLUID TRANSFER (MISCELLANEOUS) IMPLANT
SUT CHROMIC 3 0 SH 27 (SUTURE) IMPLANT
SUT PLAIN 3 0 PS2 27 (SUTURE) IMPLANT
SYR BULB IRRIG 60ML STRL (SYRINGE) ×2 IMPLANT
SYR CONTROL 10ML LL (SYRINGE) ×2 IMPLANT
TRAY ENT MC OR (CUSTOM PROCEDURE TRAY) ×2 IMPLANT
TUBING IRRIGATION (MISCELLANEOUS) ×2 IMPLANT
YANKAUER SUCT BULB TIP NO VENT (SUCTIONS) ×2 IMPLANT

## 2023-08-16 NOTE — Op Note (Signed)
 08/16/2023  8:21 AM  PATIENT:  [name]  53 y.o. female  PRE-OPERATIVE DIAGNOSIS:  NON-RESTORABLE  TEETH NUMBER TWO,THREE,FOUR,FIVE,SIX,SEVEN,EIGHT,NINE,TEN,ELEVEN,TWELVE,THIRTEEN,FOURTEEN,FIFTEEN,SIXTEEN, EIGHTEEN ,THIRTY-ONE,THIRTY-TWO SECONDARY TO DENTAL CARIES  POST-OPERATIVE DIAGNOSIS:  SAME  PROCEDURE:  Procedure(s): EXTRACTION TEETH NUMBER TWO,THREE,FOUR,FIVE,SIX,SEVEN,EIGHT,NINE,TEN,ELEVEN,TWELVE,THIRTEEN,FOURTEEN,FIFTEEN,SIXTEEN,SEVENTEEN,THIRTY-ONE,THIRTY-TWO, ALVEOLOPLASTY RIGHT AND LEFT MAXILLA  SURGEON:  Surgeon(s): Ascencion Lava, DMD  ANESTHESIA:   local and general  EBL:  minimal  DRAINS: none   SPECIMEN:  No Specimen  COUNTS:  YES  PLAN OF CARE: Discharge to home after PACU  PATIENT DISPOSITION:  PACU - hemodynamically stable.   PROCEDURE DETAILS: Dictation # [id number]  [name], DMD 08/16/2023 8:21 AM

## 2023-08-16 NOTE — H&P (Signed)
 H&P documentation  -History and Physical Reviewed  -Patient has been re-examined  -No change in the plan of care  [name]

## 2023-08-16 NOTE — Addendum Note (Signed)
 Addendum  created 08/16/23 1118 by Erin Havers, MD   Clinical Note Signed, Intraprocedure Blocks edited, SmartForm saved

## 2023-08-16 NOTE — Transfer of Care (Signed)
 Immediate Anesthesia Transfer of Care Note  Patient: [name]  Procedure(s) Performed: EXTRACTION TEETH NUMBER ONE, TWO,THREE,FOUR,FIVE,SIX,SEVEN,EIGHT,NINE,TEN,ELEVEN,TWELVE,THIRTEEN,FOURTEEN,FIFTEEN,SIXTEEN,SEVENTEEN,THIRTY-ONE,THIRTY-TWO  Patient Location: PACU  Anesthesia Type:General  Level of Consciousness: awake, alert , and oriented  Airway & Oxygen Therapy: Patient Spontanous Breathing and Patient connected to nasal cannula oxygen  Post-op Assessment: Report given to RN and Post -op Vital signs reviewed and stable  Post vital signs: Reviewed and stable  Last Vitals:  Vitals Value Taken Time  BP 140/82 08/16/23 0835  Temp 97   Pulse 93 08/16/23 0837  Resp 14 08/16/23 0837  SpO2 92 % 08/16/23 0837  Vitals shown include unfiled device data.  Last Pain:  Vitals:   08/16/23 0605  TempSrc: Oral  PainSc: 0-No pain         Complications: No notable events documented.

## 2023-08-16 NOTE — Anesthesia Postprocedure Evaluation (Signed)
 Anesthesia Post Note  Patient: [name]  Procedure(s) Performed: EXTRACTION TEETH NUMBER ONE, TWO,THREE,FOUR,FIVE,SIX,SEVEN,EIGHT,NINE,TEN,ELEVEN,TWELVE,THIRTEEN,FOURTEEN,FIFTEEN,SIXTEEN,SEVENTEEN,THIRTY-ONE,THIRTY-TWO     Patient location during evaluation: PACU Anesthesia Type: General Level of consciousness: awake and alert Pain management: pain level controlled Vital Signs Assessment: post-procedure vital signs reviewed and stable Respiratory status: spontaneous breathing, nonlabored ventilation and respiratory function stable Cardiovascular status: blood pressure returned to baseline and stable Postop Assessment: no apparent nausea or vomiting Anesthetic complications: no   No notable events documented.  Last Vitals:  Vitals:   08/16/23 0900 08/16/23 0915  BP: (!) 127/94 134/86  Pulse: 78 77  Resp: 14 15  Temp:  36.4 C  SpO2: 92% 94%    Last Pain:  Vitals:   08/16/23 0900  TempSrc:   PainSc: Asleep                 Erin Havers

## 2023-08-16 NOTE — Op Note (Signed)
 [name], [name] MEDICAL RECORD NO: [id number] ACCOUNT NO: [id number] DATE OF BIRTH: 06-09-1970 FACILITY: [facility] LOCATION: [facility] PHYSICIAN: Cornelia Dieter, DDS  Operative Report   DATE OF PROCEDURE: 08/16/2023  PREOPERATIVE DIAGNOSES: Nonrestorable teeth numbers 2, 3, 4, 5, 6, 7, 8, 9, 10, 11, 12, 13, 14, 15, 16, 17, 18, 31, and 32.  POSTOPERATIVE DIAGNOSES: Nonrestorable teeth numbers 2, 3, 4, 5, 6, 7, 8, 9, 10, 11, 12, 13, 14, 15, 16, 17, 18, 31, and 32.  PROCEDURE: Extraction of teeth numbers per above. Alveoloplasty right and left maxilla and mandible.  SURGEON: Cornelia Dieter, DDS  ANESTHESIA: General. Nasal intubation. Dr. Denese Finn attending.   DESCRIPTION OF PROCEDURE: The patient was taken to the operating room and placed on the table in supine position. General anesthesia was administered. A nasal endotracheal tube was placed and secured. The eyes were protected and the patient was draped for surgery. A time-out was  performed. The posterior pharynx was suctioned and a throat pack was placed. 2% lidocaine  with 1:100,000 epinephrine  was infiltrated and an inferior alveolar block on the right and left sides and a buccal and palatal infiltration around the maxillary  teeth to be removed. The left side was operated first. A #15 blade was used to make an incision around tooth #18. The periosteum was reflected. The tooth was elevated and removal was attempted with forceps, but the roots of the tooth fractured. The  Stryker handpiece was used under irrigation with a fissure bur to section the roots and remove bone around them and then the roots were removed. Then, the area was irrigated and closed with 3-0 chromic. Then, a #15 blade was used to make an incision  beginning in the left maxilla starting at tooth #16, carried in the buccal and palatal sulcus until tooth #6 was encountered. The periosteum was reflected. The teeth were elevated with a 301 elevator. Forceps were used to  remove teeth #12, 11, 10, 9, 8,  and 7. Teeth #13, 14, 15, and 16 fractured during attempted removal with the dental forceps necessitating the removal of bone around residual roots, use of the root tip pick, and the rongeurs to remove the remaining root fragments. Then, the sockets were  curetted. The periosteum was reflected to expose the alveolar crest, which was irregular in contour. Alveoloplasty was performed using an egg bur followed by a bone file and the area was irrigated and closed with 3-0 chromic. Then, the right side was  operated. A #15 blade was used to make an incision on teeth #31 and #32. The periosteum was reflected. The roots of #31 were sectioned with a Stryker handpiece and then removed with a 301 elevator and rongeurs. Tooth #32 was elevated and a portion of the  tooth was removed with the dental forceps. Then, the roots were sectioned and then removed with the 301 elevator. The socket was curetted and irrigated. The periosteum was reflected to expose the alveolar crest, which was irregular in contour in the  area of tooth #32. #31 with buccal over contour due to missing teeth #30, 29, and 28. The Stryker handpiece with a fissure bur was used to recontour the bone and then the area was irrigated and closed with 3-0 chromic. Then, attention was turned to the  maxillary right. A #15 blade was used to make an incision around teeth #2, 3, 4, 5, and 6 in the buccal and palatal sulcus. The teeth were elevated. Teeth #4, 5, and 6 were removed with  the dental forceps. Teeth #2 and 3 fractured upon attempted removal  and necessitating removal of bone around residual roots. The roots were removed with a root tip pick and 301 elevator. Then, alveoloplasty was performed using the egg bur and then the area was irrigated and closed with 3-0 chromic. The oral cavity was  then irrigated and suctioned. The throat pack was removed. The patient was left in the care of anesthesia for extubation and  transported to recovery with plans for discharge home.  ESTIMATED BLOOD LOSS: Minimal.   COMPLICATIONS:  None.  No specimen.   Counts were correct.       [email or web address] D: 08/16/2023 8:27:22 am T: 08/16/2023 9:30:00 am  JOB: [id number]/ [id number]

## 2023-08-16 NOTE — Anesthesia Procedure Notes (Addendum)
 Procedure Name: Intubation Date/Time: 08/16/2023 7:25 AM  Performed by: Loreda Rodriguez, CRNAPre-anesthesia Checklist: Patient identified, Emergency Drugs available, Suction available and Patient being monitored Patient Re-evaluated:Patient Re-evaluated prior to induction Oxygen Delivery Method: Circle System Utilized Preoxygenation: Pre-oxygenation with 100% oxygen Induction Type: IV induction Ventilation: Mask ventilation without difficulty Laryngoscope Size: Mac and 4 Grade View: Grade I Nasal Tubes: Nasal Rae and Nasal prep performed Tube size: 7.0 mm Number of attempts: 2 Placement Confirmation: ETT inserted through vocal cords under direct vision, positive ETCO2 and breath sounds checked- equal and bilateral Secured at: 28 cm Tube secured with: Tape Dental Injury: Teeth and Oropharynx as per pre-operative assessment  Comments: Attempt x1 by CRNA. Attempt x1 by MDA with success.

## 2023-08-17 ENCOUNTER — Encounter (HOSPITAL_COMMUNITY): Payer: Self-pay | Admitting: Oral Surgery
